# Patient Record
Sex: Female | Born: 1960 | Race: Black or African American | Hispanic: No | Marital: Single
Health system: Southern US, Community
[De-identification: ages and names within clinical notes are randomized; demographics above are authoritative.]

## PROBLEM LIST (undated history)

## (undated) DIAGNOSIS — F419 Anxiety disorder, unspecified: Secondary | ICD-10-CM

## (undated) DIAGNOSIS — I10 Essential (primary) hypertension: Secondary | ICD-10-CM

## (undated) DIAGNOSIS — Z862 Personal history of diseases of the blood and blood-forming organs and certain disorders involving the immune mechanism: Secondary | ICD-10-CM

## (undated) DIAGNOSIS — R0789 Other chest pain: Secondary | ICD-10-CM

## (undated) DIAGNOSIS — Q602 Renal agenesis, unspecified: Secondary | ICD-10-CM

## (undated) DIAGNOSIS — Z86711 Personal history of pulmonary embolism: Secondary | ICD-10-CM

## (undated) HISTORY — DX: Anxiety disorder, unspecified: F41.9

## (undated) HISTORY — DX: Personal history of pulmonary embolism: Z86.711

## (undated) HISTORY — DX: Other chest pain: R07.89

---

## 1998-04-17 ENCOUNTER — Emergency Department (HOSPITAL_COMMUNITY): Admission: EM | Admit: 1998-04-17 | Discharge: 1998-04-17 | Payer: Self-pay | Admitting: Emergency Medicine

## 1998-07-17 ENCOUNTER — Ambulatory Visit (HOSPITAL_COMMUNITY): Admission: RE | Admit: 1998-07-17 | Discharge: 1998-07-17 | Payer: Self-pay | Admitting: Unknown Physician Specialty

## 1998-07-18 ENCOUNTER — Emergency Department (HOSPITAL_COMMUNITY): Admission: EM | Admit: 1998-07-18 | Discharge: 1998-07-19 | Payer: Self-pay | Admitting: Emergency Medicine

## 1998-07-18 ENCOUNTER — Encounter: Payer: Self-pay | Admitting: Emergency Medicine

## 1999-03-24 ENCOUNTER — Emergency Department (HOSPITAL_COMMUNITY): Admission: EM | Admit: 1999-03-24 | Discharge: 1999-03-24 | Payer: Self-pay

## 2001-05-30 ENCOUNTER — Emergency Department (HOSPITAL_COMMUNITY): Admission: EM | Admit: 2001-05-30 | Discharge: 2001-05-30 | Payer: Self-pay | Admitting: *Deleted

## 2002-04-11 ENCOUNTER — Emergency Department (HOSPITAL_COMMUNITY): Admission: EM | Admit: 2002-04-11 | Discharge: 2002-04-11 | Payer: Self-pay | Admitting: Emergency Medicine

## 2002-04-11 ENCOUNTER — Encounter: Payer: Self-pay | Admitting: Emergency Medicine

## 2002-11-09 ENCOUNTER — Emergency Department (HOSPITAL_COMMUNITY): Admission: EM | Admit: 2002-11-09 | Discharge: 2002-11-09 | Payer: Self-pay | Admitting: Emergency Medicine

## 2003-05-30 ENCOUNTER — Emergency Department (HOSPITAL_COMMUNITY): Admission: EM | Admit: 2003-05-30 | Discharge: 2003-05-30 | Payer: Self-pay | Admitting: Emergency Medicine

## 2003-10-12 ENCOUNTER — Inpatient Hospital Stay (HOSPITAL_COMMUNITY): Admission: EM | Admit: 2003-10-12 | Discharge: 2003-10-13 | Payer: Self-pay | Admitting: Emergency Medicine

## 2004-03-22 ENCOUNTER — Ambulatory Visit (HOSPITAL_COMMUNITY): Admission: RE | Admit: 2004-03-22 | Discharge: 2004-03-22 | Payer: Self-pay | Admitting: *Deleted

## 2004-03-22 ENCOUNTER — Emergency Department (HOSPITAL_COMMUNITY): Admission: EM | Admit: 2004-03-22 | Discharge: 2004-03-22 | Payer: Self-pay | Admitting: Emergency Medicine

## 2004-07-07 ENCOUNTER — Emergency Department (HOSPITAL_COMMUNITY): Admission: EM | Admit: 2004-07-07 | Discharge: 2004-07-07 | Payer: Self-pay | Admitting: Emergency Medicine

## 2004-07-28 ENCOUNTER — Emergency Department (HOSPITAL_COMMUNITY): Admission: EM | Admit: 2004-07-28 | Discharge: 2004-07-28 | Payer: Self-pay | Admitting: Emergency Medicine

## 2004-09-18 ENCOUNTER — Emergency Department (HOSPITAL_COMMUNITY): Admission: EM | Admit: 2004-09-18 | Discharge: 2004-09-19 | Payer: Self-pay | Admitting: Emergency Medicine

## 2005-01-02 ENCOUNTER — Emergency Department (HOSPITAL_COMMUNITY): Admission: EM | Admit: 2005-01-02 | Discharge: 2005-01-02 | Payer: Self-pay | Admitting: Emergency Medicine

## 2005-02-02 ENCOUNTER — Emergency Department (HOSPITAL_COMMUNITY): Admission: EM | Admit: 2005-02-02 | Discharge: 2005-02-03 | Payer: Self-pay | Admitting: Emergency Medicine

## 2005-02-08 ENCOUNTER — Emergency Department (HOSPITAL_COMMUNITY): Admission: EM | Admit: 2005-02-08 | Discharge: 2005-02-09 | Payer: Self-pay | Admitting: Emergency Medicine

## 2005-04-09 ENCOUNTER — Emergency Department (HOSPITAL_COMMUNITY): Admission: EM | Admit: 2005-04-09 | Discharge: 2005-04-09 | Payer: Self-pay | Admitting: Emergency Medicine

## 2005-05-09 ENCOUNTER — Emergency Department (HOSPITAL_COMMUNITY): Admission: EM | Admit: 2005-05-09 | Discharge: 2005-05-09 | Payer: Self-pay | Admitting: Emergency Medicine

## 2005-07-04 ENCOUNTER — Emergency Department (HOSPITAL_COMMUNITY): Admission: EM | Admit: 2005-07-04 | Discharge: 2005-07-04 | Payer: Self-pay | Admitting: Emergency Medicine

## 2005-07-08 ENCOUNTER — Ambulatory Visit (HOSPITAL_COMMUNITY): Admission: RE | Admit: 2005-07-08 | Discharge: 2005-07-08 | Payer: Self-pay | Admitting: Internal Medicine

## 2005-07-08 ENCOUNTER — Ambulatory Visit: Payer: Self-pay | Admitting: Internal Medicine

## 2005-07-10 ENCOUNTER — Emergency Department (HOSPITAL_COMMUNITY): Admission: EM | Admit: 2005-07-10 | Discharge: 2005-07-10 | Payer: Self-pay | Admitting: Emergency Medicine

## 2005-07-15 ENCOUNTER — Emergency Department (HOSPITAL_COMMUNITY): Admission: EM | Admit: 2005-07-15 | Discharge: 2005-07-15 | Payer: Self-pay | Admitting: Emergency Medicine

## 2005-08-09 ENCOUNTER — Emergency Department (HOSPITAL_COMMUNITY): Admission: EM | Admit: 2005-08-09 | Discharge: 2005-08-10 | Payer: Self-pay | Admitting: Emergency Medicine

## 2005-09-06 ENCOUNTER — Emergency Department (HOSPITAL_COMMUNITY): Admission: EM | Admit: 2005-09-06 | Discharge: 2005-09-06 | Payer: Self-pay | Admitting: Emergency Medicine

## 2005-10-13 ENCOUNTER — Emergency Department (HOSPITAL_COMMUNITY): Admission: EM | Admit: 2005-10-13 | Discharge: 2005-10-13 | Payer: Self-pay | Admitting: Emergency Medicine

## 2005-10-14 ENCOUNTER — Ambulatory Visit: Payer: Self-pay | Admitting: Internal Medicine

## 2006-01-20 ENCOUNTER — Emergency Department (HOSPITAL_COMMUNITY): Admission: EM | Admit: 2006-01-20 | Discharge: 2006-01-20 | Payer: Self-pay | Admitting: Emergency Medicine

## 2006-01-23 ENCOUNTER — Emergency Department (HOSPITAL_COMMUNITY): Admission: EM | Admit: 2006-01-23 | Discharge: 2006-01-23 | Payer: Self-pay | Admitting: Emergency Medicine

## 2006-01-27 ENCOUNTER — Emergency Department (HOSPITAL_COMMUNITY): Admission: EM | Admit: 2006-01-27 | Discharge: 2006-01-27 | Payer: Self-pay | Admitting: Emergency Medicine

## 2006-02-18 ENCOUNTER — Ambulatory Visit: Payer: Self-pay | Admitting: Internal Medicine

## 2006-02-19 ENCOUNTER — Emergency Department (HOSPITAL_COMMUNITY): Admission: EM | Admit: 2006-02-19 | Discharge: 2006-02-19 | Payer: Self-pay | Admitting: Emergency Medicine

## 2006-02-21 ENCOUNTER — Encounter (INDEPENDENT_AMBULATORY_CARE_PROVIDER_SITE_OTHER): Payer: Self-pay | Admitting: *Deleted

## 2006-03-03 ENCOUNTER — Ambulatory Visit: Payer: Self-pay | Admitting: Hospitalist

## 2006-03-03 ENCOUNTER — Ambulatory Visit (HOSPITAL_COMMUNITY): Admission: RE | Admit: 2006-03-03 | Discharge: 2006-03-03 | Payer: Self-pay | Admitting: Hospitalist

## 2006-07-05 ENCOUNTER — Emergency Department (HOSPITAL_COMMUNITY): Admission: EM | Admit: 2006-07-05 | Discharge: 2006-07-05 | Payer: Self-pay | Admitting: Emergency Medicine

## 2006-07-18 ENCOUNTER — Encounter (INDEPENDENT_AMBULATORY_CARE_PROVIDER_SITE_OTHER): Payer: Self-pay | Admitting: *Deleted

## 2006-07-18 DIAGNOSIS — F411 Generalized anxiety disorder: Secondary | ICD-10-CM | POA: Insufficient documentation

## 2006-07-18 DIAGNOSIS — J45909 Unspecified asthma, uncomplicated: Secondary | ICD-10-CM | POA: Insufficient documentation

## 2006-07-18 DIAGNOSIS — R079 Chest pain, unspecified: Secondary | ICD-10-CM

## 2006-07-18 DIAGNOSIS — I10 Essential (primary) hypertension: Secondary | ICD-10-CM | POA: Insufficient documentation

## 2006-07-18 DIAGNOSIS — R002 Palpitations: Secondary | ICD-10-CM | POA: Insufficient documentation

## 2006-09-17 ENCOUNTER — Emergency Department (HOSPITAL_COMMUNITY): Admission: EM | Admit: 2006-09-17 | Discharge: 2006-09-17 | Payer: Self-pay | Admitting: Emergency Medicine

## 2006-09-20 ENCOUNTER — Ambulatory Visit: Payer: Self-pay | Admitting: Hospitalist

## 2006-09-20 ENCOUNTER — Encounter (INDEPENDENT_AMBULATORY_CARE_PROVIDER_SITE_OTHER): Payer: Self-pay | Admitting: Unknown Physician Specialty

## 2006-09-20 ENCOUNTER — Ambulatory Visit (HOSPITAL_COMMUNITY): Admission: RE | Admit: 2006-09-20 | Discharge: 2006-09-20 | Payer: Self-pay | Admitting: Hospitalist

## 2006-09-20 ENCOUNTER — Encounter (INDEPENDENT_AMBULATORY_CARE_PROVIDER_SITE_OTHER): Payer: Self-pay | Admitting: *Deleted

## 2006-09-20 LAB — CONVERTED CEMR LAB
Amphetamine Screen, Ur: NEGATIVE
Barbiturate Quant, Ur: NEGATIVE
Benzodiazepines.: NEGATIVE
CK-MB: 2.5 ng/mL (ref 0.3–4.0)
Cocaine Metabolites: NEGATIVE
Marijuana Metabolite: NEGATIVE
Methadone: NEGATIVE
Total CK: 228 units/L — ABNORMAL HIGH (ref 7–177)

## 2006-09-21 ENCOUNTER — Telehealth: Payer: Self-pay | Admitting: *Deleted

## 2006-09-22 ENCOUNTER — Ambulatory Visit: Payer: Self-pay | Admitting: Cardiovascular Disease

## 2006-09-23 ENCOUNTER — Emergency Department (HOSPITAL_COMMUNITY): Admission: EM | Admit: 2006-09-23 | Discharge: 2006-09-23 | Payer: Self-pay | Admitting: Family Medicine

## 2006-09-25 ENCOUNTER — Emergency Department (HOSPITAL_COMMUNITY): Admission: EM | Admit: 2006-09-25 | Discharge: 2006-09-25 | Payer: Self-pay | Admitting: Emergency Medicine

## 2006-11-28 ENCOUNTER — Encounter (INDEPENDENT_AMBULATORY_CARE_PROVIDER_SITE_OTHER): Payer: Self-pay | Admitting: Internal Medicine

## 2007-08-07 ENCOUNTER — Telehealth: Payer: Self-pay | Admitting: *Deleted

## 2007-08-07 ENCOUNTER — Emergency Department (HOSPITAL_COMMUNITY): Admission: EM | Admit: 2007-08-07 | Discharge: 2007-08-07 | Payer: Self-pay | Admitting: Emergency Medicine

## 2007-08-12 ENCOUNTER — Emergency Department (HOSPITAL_COMMUNITY): Admission: EM | Admit: 2007-08-12 | Discharge: 2007-08-12 | Payer: Self-pay | Admitting: Emergency Medicine

## 2007-08-15 ENCOUNTER — Ambulatory Visit: Payer: Self-pay | Admitting: Internal Medicine

## 2007-08-15 ENCOUNTER — Emergency Department (HOSPITAL_COMMUNITY): Admission: EM | Admit: 2007-08-15 | Discharge: 2007-08-15 | Payer: Self-pay | Admitting: Emergency Medicine

## 2007-08-15 ENCOUNTER — Encounter (INDEPENDENT_AMBULATORY_CARE_PROVIDER_SITE_OTHER): Payer: Self-pay | Admitting: Internal Medicine

## 2007-08-15 DIAGNOSIS — D689 Coagulation defect, unspecified: Secondary | ICD-10-CM

## 2007-08-15 DIAGNOSIS — A599 Trichomoniasis, unspecified: Secondary | ICD-10-CM

## 2007-08-15 LAB — CONVERTED CEMR LAB
Chlamydia, Swab/Urine, PCR: NEGATIVE
GC Probe Amp, Urine: NEGATIVE
Ketones, ur: NEGATIVE mg/dL
Leukocytes, UA: NEGATIVE
Nitrite: NEGATIVE
Specific Gravity, Urine: 1.025 (ref 1.005–1.03)
Urobilinogen, UA: 0.2 (ref 0.0–1.0)
pH: 5.5 (ref 5.0–8.0)

## 2007-08-16 ENCOUNTER — Encounter (INDEPENDENT_AMBULATORY_CARE_PROVIDER_SITE_OTHER): Payer: Self-pay | Admitting: Internal Medicine

## 2007-08-16 ENCOUNTER — Ambulatory Visit: Payer: Self-pay | Admitting: Internal Medicine

## 2007-08-16 LAB — CONVERTED CEMR LAB: TSH: 0.78 microintl units/mL (ref 0.350–5.50)

## 2007-08-18 ENCOUNTER — Emergency Department (HOSPITAL_COMMUNITY): Admission: EM | Admit: 2007-08-18 | Discharge: 2007-08-19 | Payer: Self-pay | Admitting: Emergency Medicine

## 2007-08-23 ENCOUNTER — Telehealth: Payer: Self-pay | Admitting: *Deleted

## 2007-08-23 LAB — CONVERTED CEMR LAB: Protein S Ag, Total: 129 % (ref 70–140)

## 2007-08-25 ENCOUNTER — Emergency Department (HOSPITAL_COMMUNITY): Admission: EM | Admit: 2007-08-25 | Discharge: 2007-08-25 | Payer: Self-pay | Admitting: Emergency Medicine

## 2007-08-30 ENCOUNTER — Encounter (INDEPENDENT_AMBULATORY_CARE_PROVIDER_SITE_OTHER): Payer: Self-pay | Admitting: Internal Medicine

## 2007-08-30 ENCOUNTER — Ambulatory Visit: Payer: Self-pay | Admitting: Internal Medicine

## 2007-08-31 ENCOUNTER — Telehealth: Payer: Self-pay | Admitting: *Deleted

## 2007-08-31 LAB — CONVERTED CEMR LAB
Anti Nuclear Antibody(ANA): NEGATIVE
BUN: 23 mg/dL (ref 6–23)
CO2: 23 meq/L (ref 19–32)
Calcium: 9.5 mg/dL (ref 8.4–10.5)
Creatinine, Ser: 0.81 mg/dL (ref 0.40–1.20)
Glucose, Bld: 84 mg/dL (ref 70–99)
HCT: 39.3 % (ref 36.0–46.0)
Hemoglobin: 12.6 g/dL (ref 12.0–15.0)
MCHC: 32.1 g/dL (ref 30.0–36.0)
MCV: 89.9 fL (ref 78.0–100.0)
Microalb, Ur: 0.2 mg/dL (ref 0.00–1.89)
Prothrombin Time: 12.5 s (ref 11.6–15.2)
RBC: 4.37 M/uL (ref 3.87–5.11)
WBC: 6.1 10*3/uL (ref 4.0–10.5)

## 2007-09-04 ENCOUNTER — Emergency Department (HOSPITAL_COMMUNITY): Admission: EM | Admit: 2007-09-04 | Discharge: 2007-09-05 | Payer: Self-pay | Admitting: Emergency Medicine

## 2007-09-08 ENCOUNTER — Emergency Department (HOSPITAL_COMMUNITY): Admission: EM | Admit: 2007-09-08 | Discharge: 2007-09-08 | Payer: Self-pay | Admitting: Emergency Medicine

## 2007-09-13 ENCOUNTER — Emergency Department (HOSPITAL_COMMUNITY): Admission: EM | Admit: 2007-09-13 | Discharge: 2007-09-13 | Payer: Self-pay | Admitting: Emergency Medicine

## 2007-09-13 ENCOUNTER — Ambulatory Visit: Payer: Self-pay | Admitting: Internal Medicine

## 2007-09-13 ENCOUNTER — Encounter: Payer: Self-pay | Admitting: Internal Medicine

## 2007-09-13 DIAGNOSIS — F4521 Hypochondriasis: Secondary | ICD-10-CM | POA: Insufficient documentation

## 2007-09-13 DIAGNOSIS — Z86718 Personal history of other venous thrombosis and embolism: Secondary | ICD-10-CM | POA: Insufficient documentation

## 2007-09-13 DIAGNOSIS — Z888 Allergy status to other drugs, medicaments and biological substances status: Secondary | ICD-10-CM

## 2007-09-13 DIAGNOSIS — R635 Abnormal weight gain: Secondary | ICD-10-CM | POA: Insufficient documentation

## 2007-09-13 DIAGNOSIS — F410 Panic disorder [episodic paroxysmal anxiety] without agoraphobia: Secondary | ICD-10-CM

## 2007-09-13 LAB — CONVERTED CEMR LAB
AST: 21 units/L (ref 0–37)
Albumin: 3.4 g/dL — ABNORMAL LOW (ref 3.5–5.2)
BUN: 18 mg/dL (ref 6–23)
Bilirubin, Direct: <0.1 mg/dL (ref 0.0–0.3)
Creatinine, Ser: 0.94 mg/dL (ref 0.40–1.20)
Glucose, Bld: 103 mg/dL — ABNORMAL HIGH (ref 70–99)
Potassium: 3.9 meq/L (ref 3.5–5.3)
Total Bilirubin: 0.5 mg/dL (ref 0.3–1.2)
Total Protein: 6.9 g/dL (ref 6.0–8.3)

## 2007-09-18 ENCOUNTER — Encounter: Payer: Self-pay | Admitting: Internal Medicine

## 2007-09-27 ENCOUNTER — Emergency Department (HOSPITAL_COMMUNITY): Admission: EM | Admit: 2007-09-27 | Discharge: 2007-09-27 | Payer: Self-pay | Admitting: Emergency Medicine

## 2007-10-08 ENCOUNTER — Emergency Department (HOSPITAL_COMMUNITY): Admission: EM | Admit: 2007-10-08 | Discharge: 2007-10-08 | Payer: Self-pay | Admitting: Emergency Medicine

## 2007-10-09 ENCOUNTER — Emergency Department (HOSPITAL_COMMUNITY): Admission: EM | Admit: 2007-10-09 | Discharge: 2007-10-09 | Payer: Self-pay | Admitting: Emergency Medicine

## 2007-10-21 ENCOUNTER — Emergency Department (HOSPITAL_COMMUNITY): Admission: EM | Admit: 2007-10-21 | Discharge: 2007-10-21 | Payer: Self-pay | Admitting: Emergency Medicine

## 2007-10-23 ENCOUNTER — Emergency Department (HOSPITAL_COMMUNITY): Admission: EM | Admit: 2007-10-23 | Discharge: 2007-10-23 | Payer: Self-pay | Admitting: Emergency Medicine

## 2007-10-24 ENCOUNTER — Telehealth: Payer: Self-pay | Admitting: *Deleted

## 2007-10-24 ENCOUNTER — Ambulatory Visit: Payer: Self-pay | Admitting: Internal Medicine

## 2007-10-24 ENCOUNTER — Encounter (INDEPENDENT_AMBULATORY_CARE_PROVIDER_SITE_OTHER): Payer: Self-pay | Admitting: Internal Medicine

## 2007-10-24 ENCOUNTER — Emergency Department (HOSPITAL_COMMUNITY): Admission: EM | Admit: 2007-10-24 | Discharge: 2007-10-24 | Payer: Self-pay | Admitting: Emergency Medicine

## 2007-10-24 DIAGNOSIS — M545 Low back pain: Secondary | ICD-10-CM

## 2007-10-26 ENCOUNTER — Ambulatory Visit (HOSPITAL_COMMUNITY): Admission: RE | Admit: 2007-10-26 | Discharge: 2007-10-26 | Payer: Self-pay | Admitting: Internal Medicine

## 2008-01-12 ENCOUNTER — Telehealth (INDEPENDENT_AMBULATORY_CARE_PROVIDER_SITE_OTHER): Payer: Self-pay | Admitting: *Deleted

## 2008-01-12 ENCOUNTER — Emergency Department (HOSPITAL_COMMUNITY): Admission: EM | Admit: 2008-01-12 | Discharge: 2008-01-12 | Payer: Self-pay | Admitting: Family Medicine

## 2008-02-28 ENCOUNTER — Encounter: Payer: Self-pay | Admitting: Licensed Clinical Social Worker

## 2008-05-08 ENCOUNTER — Emergency Department (HOSPITAL_COMMUNITY): Admission: EM | Admit: 2008-05-08 | Discharge: 2008-05-08 | Payer: Self-pay | Admitting: Emergency Medicine

## 2008-05-12 ENCOUNTER — Emergency Department (HOSPITAL_COMMUNITY): Admission: EM | Admit: 2008-05-12 | Discharge: 2008-05-12 | Payer: Self-pay | Admitting: Emergency Medicine

## 2008-05-26 ENCOUNTER — Emergency Department (HOSPITAL_COMMUNITY): Admission: EM | Admit: 2008-05-26 | Discharge: 2008-05-26 | Payer: Self-pay | Admitting: Emergency Medicine

## 2008-05-27 ENCOUNTER — Emergency Department (HOSPITAL_COMMUNITY): Admission: EM | Admit: 2008-05-27 | Discharge: 2008-05-27 | Payer: Self-pay | Admitting: Emergency Medicine

## 2008-06-25 ENCOUNTER — Telehealth (INDEPENDENT_AMBULATORY_CARE_PROVIDER_SITE_OTHER): Payer: Self-pay | Admitting: *Deleted

## 2008-07-01 ENCOUNTER — Telehealth: Payer: Self-pay | Admitting: *Deleted

## 2008-07-11 ENCOUNTER — Ambulatory Visit: Payer: Self-pay | Admitting: Internal Medicine

## 2008-07-11 ENCOUNTER — Encounter (INDEPENDENT_AMBULATORY_CARE_PROVIDER_SITE_OTHER): Payer: Self-pay | Admitting: Internal Medicine

## 2008-07-11 DIAGNOSIS — K219 Gastro-esophageal reflux disease without esophagitis: Secondary | ICD-10-CM | POA: Insufficient documentation

## 2008-07-15 ENCOUNTER — Encounter: Payer: Self-pay | Admitting: Licensed Clinical Social Worker

## 2008-08-18 IMAGING — CR DG CHEST 2V
2 series · 2 of 2 positions shown · non-contrast
Comparison: 09/17/06.

CLINICAL DATA: Fever, headache.  
 CHEST - 2 VIEW:

[w chest pa]
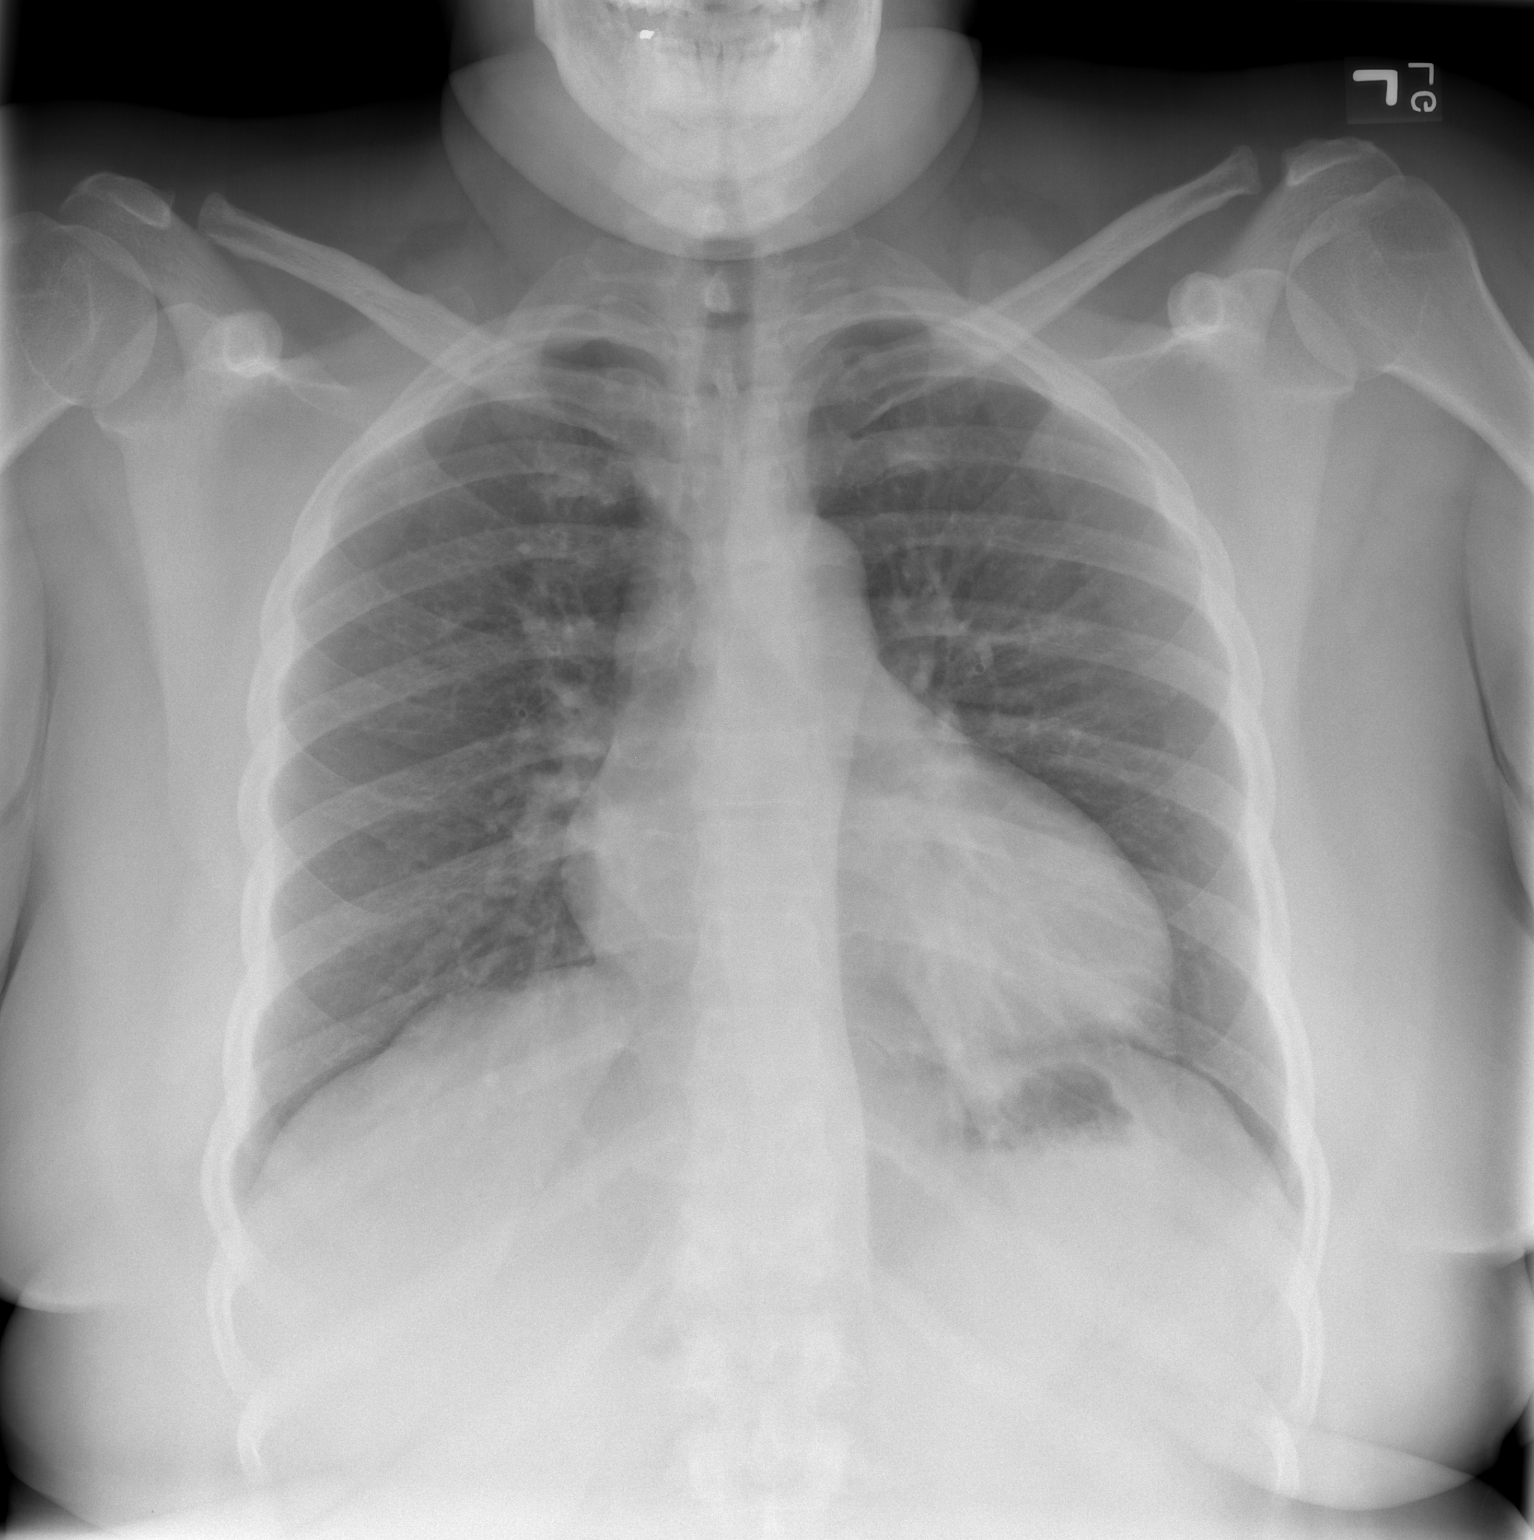

[w chest lat]
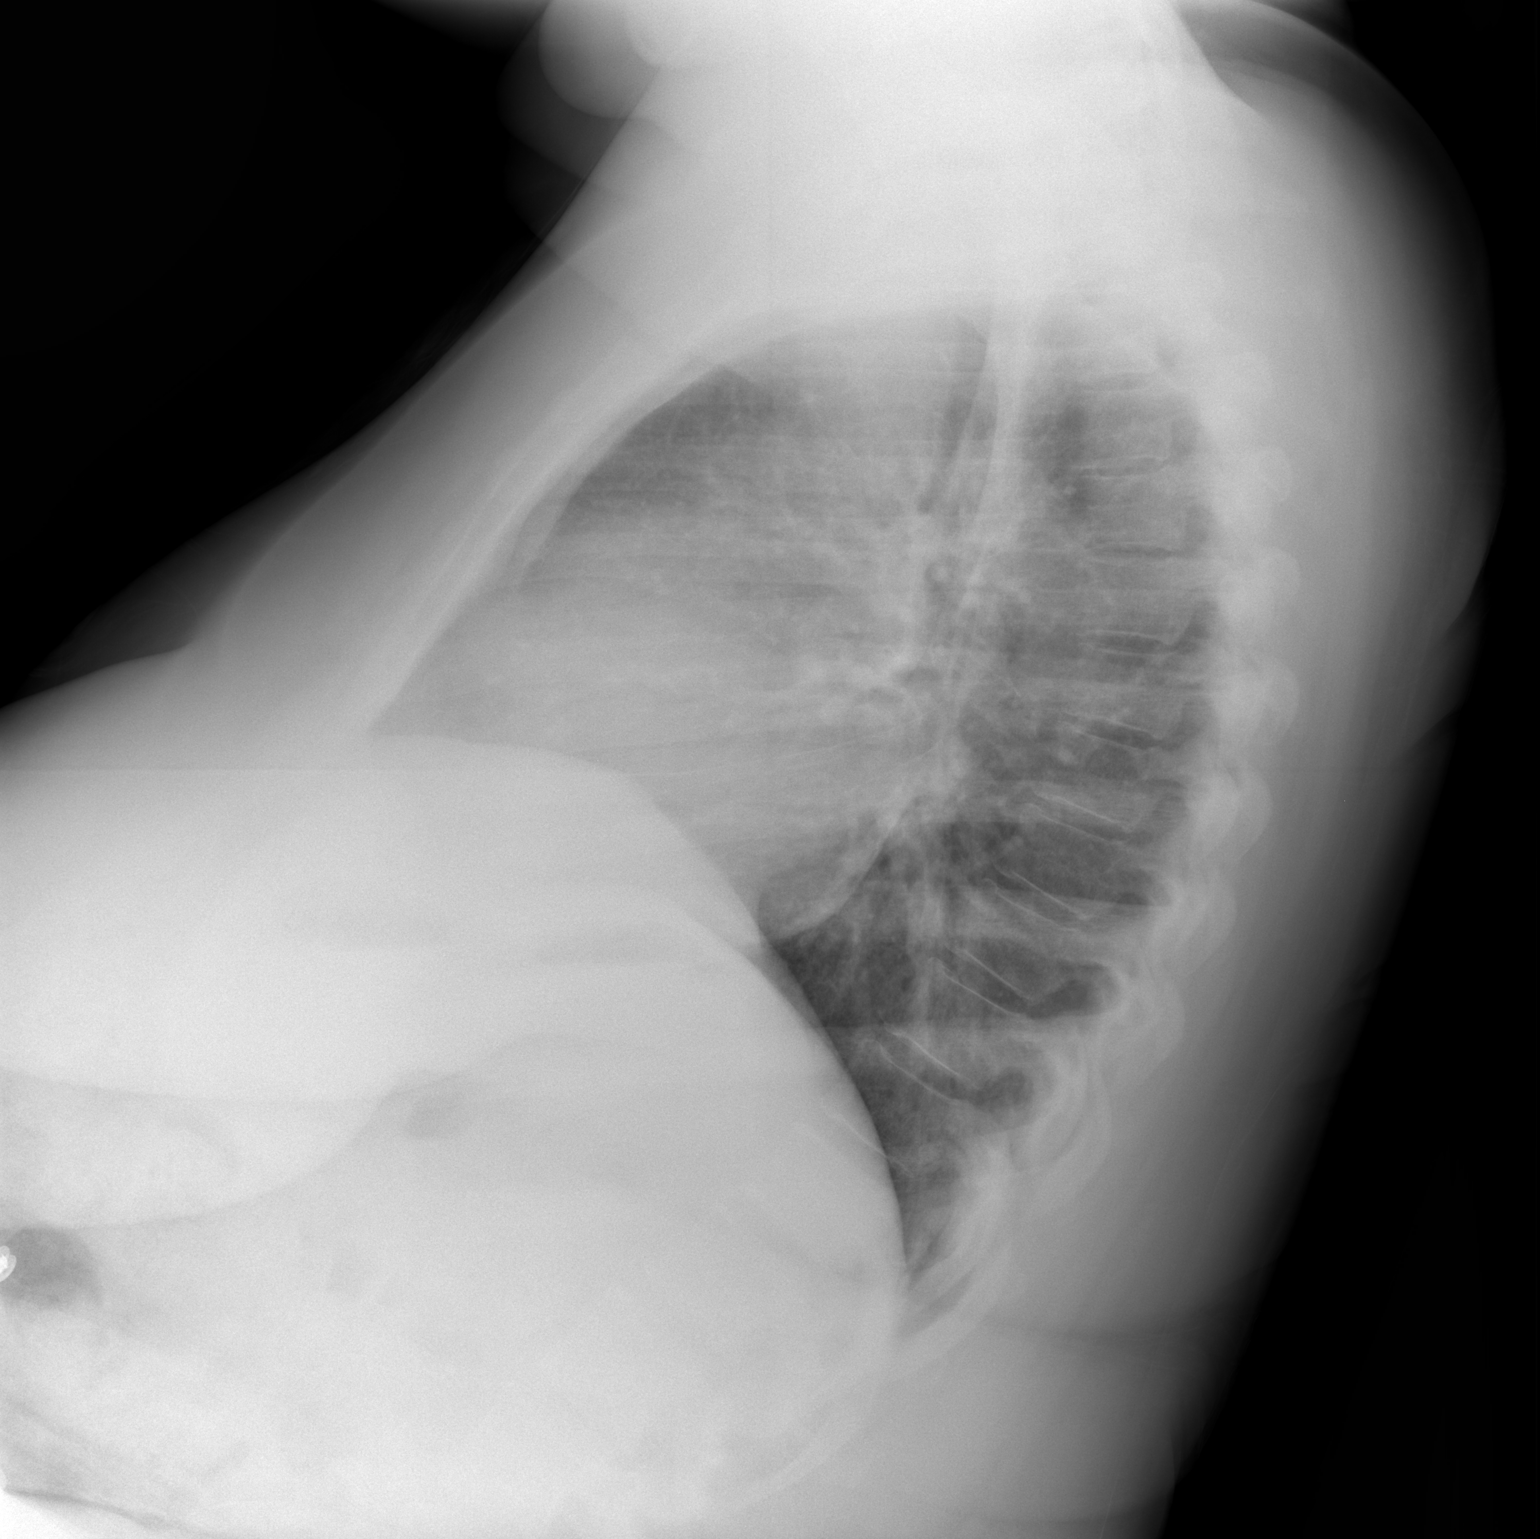

[2 of 2 positions shown; findings below may reference images not displayed]

FINDINGS: There is mild cardiomegaly.  The lungs are clear.  No pleural effusion.  No focal bony abnormality.
IMPRESSION: No acute disease with mild cardiomegaly noted.

## 2008-10-13 ENCOUNTER — Emergency Department (HOSPITAL_COMMUNITY): Admission: EM | Admit: 2008-10-13 | Discharge: 2008-10-13 | Payer: Self-pay | Admitting: Emergency Medicine

## 2008-11-01 ENCOUNTER — Emergency Department (HOSPITAL_COMMUNITY): Admission: EM | Admit: 2008-11-01 | Discharge: 2008-11-01 | Payer: Self-pay | Admitting: Emergency Medicine

## 2008-11-26 ENCOUNTER — Emergency Department (HOSPITAL_COMMUNITY): Admission: EM | Admit: 2008-11-26 | Discharge: 2008-11-26 | Payer: Self-pay | Admitting: Emergency Medicine

## 2008-11-29 ENCOUNTER — Emergency Department (HOSPITAL_COMMUNITY): Admission: EM | Admit: 2008-11-29 | Discharge: 2008-11-29 | Payer: Self-pay | Admitting: Emergency Medicine

## 2008-12-01 ENCOUNTER — Emergency Department (HOSPITAL_COMMUNITY): Admission: EM | Admit: 2008-12-01 | Discharge: 2008-12-01 | Payer: Self-pay | Admitting: Emergency Medicine

## 2008-12-02 ENCOUNTER — Encounter (INDEPENDENT_AMBULATORY_CARE_PROVIDER_SITE_OTHER): Payer: Self-pay | Admitting: *Deleted

## 2008-12-02 ENCOUNTER — Ambulatory Visit: Payer: Self-pay | Admitting: Vascular Surgery

## 2008-12-02 ENCOUNTER — Encounter: Payer: Self-pay | Admitting: Internal Medicine

## 2008-12-02 ENCOUNTER — Ambulatory Visit: Payer: Self-pay | Admitting: Internal Medicine

## 2008-12-02 ENCOUNTER — Ambulatory Visit: Admission: RE | Admit: 2008-12-02 | Discharge: 2008-12-02 | Payer: Self-pay | Admitting: Internal Medicine

## 2008-12-02 DIAGNOSIS — R609 Edema, unspecified: Secondary | ICD-10-CM | POA: Insufficient documentation

## 2008-12-03 ENCOUNTER — Observation Stay (HOSPITAL_COMMUNITY): Admission: EM | Admit: 2008-12-03 | Discharge: 2008-12-03 | Payer: Self-pay | Admitting: Emergency Medicine

## 2008-12-03 LAB — CONVERTED CEMR LAB
ALT: 19 units/L (ref 0–35)
AST: 18 units/L (ref 0–37)
Alkaline Phosphatase: 54 units/L (ref 39–117)
BUN: 17 mg/dL (ref 6–23)
Bilirubin Urine: NEGATIVE
Chloride: 105 meq/L (ref 96–112)
Creatinine, Ser: 1.21 mg/dL — ABNORMAL HIGH (ref 0.40–1.20)
GFR calc non Af Amer: 47 mL/min — ABNORMAL LOW (ref >60)
Ketones, ur: NEGATIVE mg/dL
Specific Gravity, Urine: 1.019 (ref 1.005–1.030)
TSH: 0.766 microintl units/mL (ref 0.350–4.500)
Total Bilirubin: 0.3 mg/dL (ref 0.3–1.2)
Urine Glucose: NEGATIVE mg/dL
Urobilinogen, UA: 0.2 (ref 0.0–1.0)
pH: 6 (ref 5.0–8.0)

## 2008-12-08 ENCOUNTER — Emergency Department (HOSPITAL_COMMUNITY): Admission: EM | Admit: 2008-12-08 | Discharge: 2008-12-08 | Payer: Self-pay | Admitting: Emergency Medicine

## 2008-12-22 ENCOUNTER — Other Ambulatory Visit: Payer: Self-pay | Admitting: Emergency Medicine

## 2008-12-22 ENCOUNTER — Emergency Department (HOSPITAL_COMMUNITY): Admission: EM | Admit: 2008-12-22 | Discharge: 2008-12-22 | Payer: Self-pay | Admitting: Emergency Medicine

## 2008-12-29 ENCOUNTER — Encounter: Payer: Self-pay | Admitting: Internal Medicine

## 2008-12-29 ENCOUNTER — Emergency Department (HOSPITAL_COMMUNITY): Admission: EM | Admit: 2008-12-29 | Discharge: 2008-12-29 | Payer: Self-pay | Admitting: Emergency Medicine

## 2009-07-20 ENCOUNTER — Emergency Department (HOSPITAL_COMMUNITY): Admission: EM | Admit: 2009-07-20 | Discharge: 2009-07-20 | Payer: Self-pay | Admitting: Emergency Medicine

## 2009-08-08 ENCOUNTER — Emergency Department (HOSPITAL_COMMUNITY): Admission: EM | Admit: 2009-08-08 | Discharge: 2009-08-09 | Payer: Self-pay | Admitting: Emergency Medicine

## 2009-08-17 ENCOUNTER — Emergency Department (HOSPITAL_COMMUNITY): Admission: EM | Admit: 2009-08-17 | Discharge: 2009-08-17 | Payer: Self-pay | Admitting: Emergency Medicine

## 2009-08-23 ENCOUNTER — Emergency Department (HOSPITAL_COMMUNITY): Admission: EM | Admit: 2009-08-23 | Discharge: 2009-08-23 | Payer: Self-pay | Admitting: Emergency Medicine

## 2009-09-19 ENCOUNTER — Emergency Department (HOSPITAL_COMMUNITY): Admission: EM | Admit: 2009-09-19 | Discharge: 2009-09-19 | Payer: Self-pay | Admitting: Emergency Medicine

## 2009-10-08 ENCOUNTER — Emergency Department (HOSPITAL_COMMUNITY): Admission: EM | Admit: 2009-10-08 | Discharge: 2009-10-08 | Payer: Self-pay | Admitting: Emergency Medicine

## 2009-10-08 ENCOUNTER — Encounter: Payer: Self-pay | Admitting: Internal Medicine

## 2009-10-08 ENCOUNTER — Telehealth: Payer: Self-pay | Admitting: Internal Medicine

## 2009-10-09 ENCOUNTER — Encounter: Payer: Self-pay | Admitting: Internal Medicine

## 2009-10-09 ENCOUNTER — Telehealth: Payer: Self-pay | Admitting: Internal Medicine

## 2009-10-16 IMAGING — CR DG CHEST 2V
2 series · 2 of 2 positions shown · non-contrast
Comparison: 05/26/2008

CLINICAL DATA: Pain.

CHEST - 2 VIEW

[w chest pa]
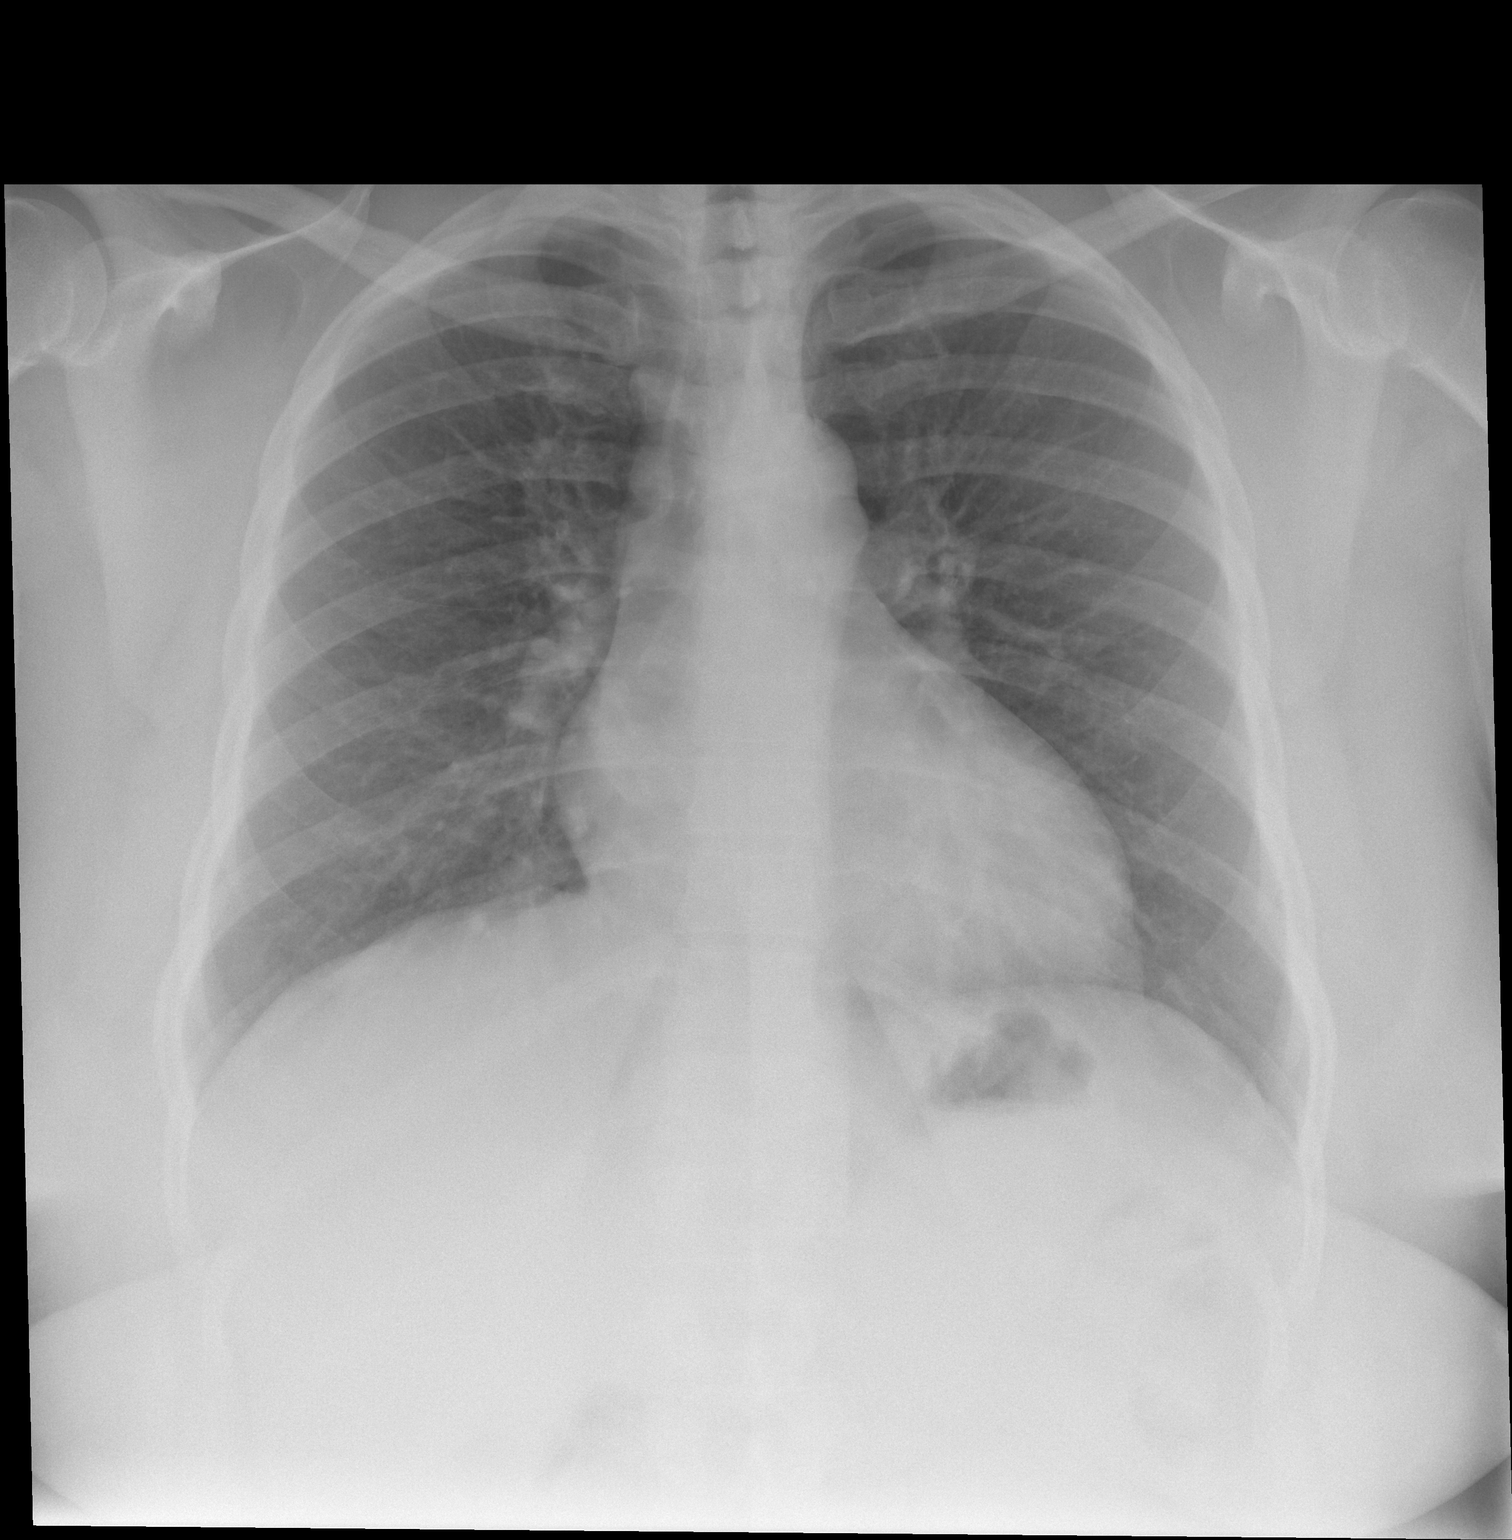

[w chest lat]
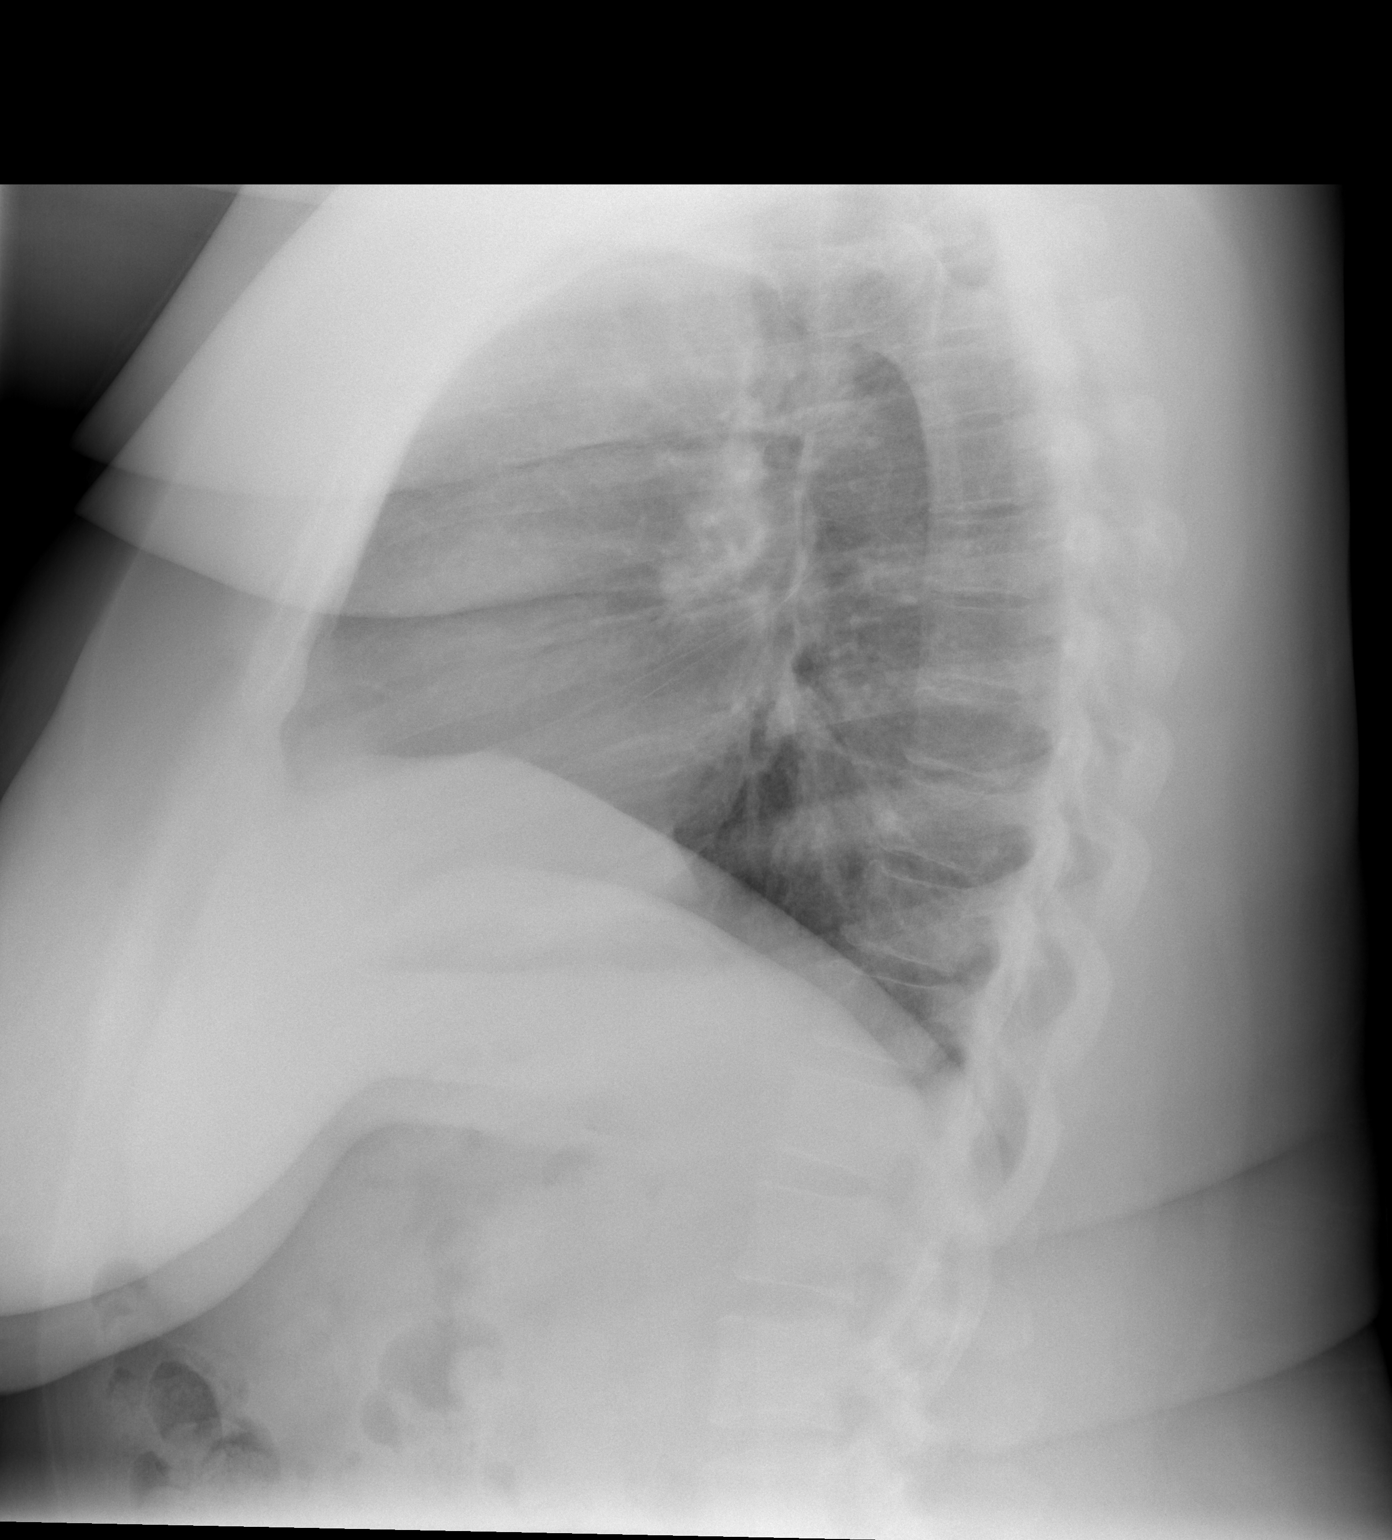

[2 of 2 positions shown; findings below may reference images not displayed]

FINDINGS: Heart is upper limits normal in size.  Lungs are clear.
No effusions.  No acute bony abnormality.
IMPRESSION: No acute findings.

## 2009-12-04 ENCOUNTER — Emergency Department (HOSPITAL_COMMUNITY): Admission: EM | Admit: 2009-12-04 | Discharge: 2009-12-04 | Payer: Self-pay | Admitting: Emergency Medicine

## 2009-12-20 ENCOUNTER — Inpatient Hospital Stay (HOSPITAL_COMMUNITY): Admission: EM | Admit: 2009-12-20 | Discharge: 2009-12-21 | Payer: Self-pay | Admitting: Emergency Medicine

## 2009-12-20 ENCOUNTER — Encounter (INDEPENDENT_AMBULATORY_CARE_PROVIDER_SITE_OTHER): Payer: Self-pay | Admitting: Internal Medicine

## 2009-12-20 ENCOUNTER — Ambulatory Visit: Payer: Self-pay | Admitting: Internal Medicine

## 2009-12-25 ENCOUNTER — Encounter (INDEPENDENT_AMBULATORY_CARE_PROVIDER_SITE_OTHER): Payer: Self-pay | Admitting: Internal Medicine

## 2010-09-01 NOTE — Progress Notes (Signed)
Summary: refill/gg  Phone Note Refill Request  on October 08, 2009 3:53 PM  Refills Requested: Medication #1:  ARIXTRA 7.5 MG/0.6ML  SOLN Take 7.5 mg Subcutaneously daily. 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  I know of no reason to be on this for yrs.  Will deny.  PT has appt with Dr Coralee Pesa.  Last time she got this was in 2010 from ER.  Follow-up by: Blanch Media MD,  October 08, 2009 4:44 PM     Appended Document: refill/gg Pt called to inform, no answer  Will try again

## 2010-09-01 NOTE — Miscellaneous (Signed)
  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

## 2010-09-01 NOTE — Progress Notes (Signed)
Summary: phone/gg  Phone Note Call from Patient   Summary of Call: Placed a call to pt to change appointment time.  She has been given an appointment with Dr Coralee Pesa for March 29th to address medication questions and routine exam as she has not been seen since 12/02/08.  SHe has been reassigned as Dr Reynold Bowen is not with the clinic at this time. Pt stated she was seen in the ED last night and was given the injection she requested ( arixtra) she was angry and stated she was getting a Clinical research associate and will sue the clinic and Dr Phillips Odor. [facility] states Dr Phillips Odor sent a record to the ED stating she was mentally ill. She wants this taken off her record.   I made the clinic appointment and explained she needs to come in and talk with her new PCP to get all her meds reviewed to see exactly what she needs to be taking.  Patient  verbalizes understanding of these instructions.  Initial call taken by: Merrie Roof RN,  October 09, 2009 10:29 AM  Follow-up for Phone Call        I did not see Ms. Condon in the ED on 3/9. The resident on call, Dr. Aldine Contes spoke with the ED physician and the patient and discussed the case with me in detail over the phone. Dr. Aldine Contes entered a note into the EMR discussing her exchange in the ED with Ms. Chales Abrahams. No letter regarding mental illness was made.  At present the patient has no indication for the medication Arixtra- especially at the full treatment dose that she is requesting. Giving her this medication at treatment dose would not be medically sound treatment or appropriate. All testing for protien C/S deficiency has been negative and CT of her chest has not shown a PE. This patient does not recieve routine care in our office and such medication would require a complete inquiry and some solid documentation. Hopefully Dr. Coralee Pesa can clarify her situation  at her follow-up appointment. I am concerned about providing such a high risk medication without a clear indictaion. I will contact  risk management regarding her threats. Although I did not see the patient, I was the attending on call and supported Dr. Aldine Contes in her plan of care. The ED physician made a decision to give the Arixtra. Our team did not feel comfortable doing this, and offered the patient a clinic appointment to discuss her concerns and to explore the nature of her request for the medication. The patient had no acute or urgent need for this medicaton. Follow-up by: Julaine Fusi  DO,  October 09, 2009 7:14 PM  Additional Follow-up for Phone Call Additional follow up Details #1::        I agree. Additional Follow-up by: Zoila Shutter MD,  October 10, 2009 11:52 AM    Additional Follow-up for Phone Call Additional follow up Details #2::    Agree as well.  Thank you Follow-up by: Mliss Sax MD,  October 10, 2009 4:03 PM

## 2010-09-01 NOTE — Miscellaneous (Signed)
Summary: Admission H and P  INTERNAL MEDICINE ADMISSION HISTORY AND PHYSICAL First Contact: Brooks Sailors [phone]) Second Contact: Vassie Loll [phone]) Attending: Dr. Blanch Media  PCP: Coralee Pesa  CC: HA / Dizziness / slurred speech/ right sided weakness  HPI:  Pt is a 50 yo female with PMH significant for HTN, protein c and s deficiency, h/o PE who presents to the ED with complaint of slurred speech, right sided weakness, HA, and dizziness.  Of note, she has presented to the ED many times in the past with similar complaints, never with any objective evidence of pathology.  She reports that her HA began 3 days PTA.  It is located just left of midline in the top of her head and behind her left eye.  It has been intermittent and 10/10 at its worst.  It has been associated with some dark spots in her vision as well as tearing on the left and rhinorrhea.  She has had similar episodes several times in the past.  This morning she was watching a movie and began to feel dizzy.  She describes it as light-headed.  She denies sensation of the room spinning.  She endorses an associated slurring of her speech and weakness in her right arm. These symptoms did not last long and had resolved by the time she presented to the ED.  She has had similar episodes 4-5 times in the past.  She denies any associated chest pain although occasionally has a sharp pain in her chest that she thought was related to indigestion but did not respond to rolaids.  This pain has not been associated with exertion.  She denies N/V/diarrhea or fevers and chills.  ALLERGIES: ! VICODIN ! HYDROCODONE ! IBUPROFEN ! NAPROXEN (NAPROXEN) ! CLONIDINE HCL   PAST MEDICAL HISTORY: Anxiety Asthma Hypertension Atypical chest pain with prior evaluations Episodes of right sided weakness, with multiple negative CT, MRI, and carotid duplexes HIstory of PE that despite great efforts, we have no documentation of  Protein C and Protein S  deficiency  MEDICATIONS: ARIXTRA 7.5 MG/0.6ML  SOLN (FONDAPARINUX SODIUM) Take 7.5 mg Subcutaneously daily. LASIX 20 MG  TABS (FUROSEMIDE) Take 1 tablet by mouth once a day. VENTOLIN HFA 108 (90 BASE) MCG/ACT AERS (ALBUTEROL SULFATE) Take one to two puffs every 6 hours as needed for shortness of breath. ASPIRIN 81mg  by mouth once daily. Protonix 40mg  daily (no taking) Fluticasone inhale 2puff two times a day (no taking)    SOCIAL HISTORY: Lives in [place] with her children.  She has lived in [place] in the past and some of her diagnoses were apparently made there. 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   Head:  normocephalic and atraumatic.   Eyes:  vision grossly intact, pupils equal, pupils round, pupils reactive to light, no injection and anicteric.  EOMI Mouth:  pharynx pink and moist, no erythema, and no exudates.   Neck:  supple, full ROM,  no JVD, and no carotid bruits.   Lungs:  normal respiratory effort, no accessory muscle use, normal breath sounds,  no crackles, and no wheezes.  Heart:  normal rate, regular rhythm, no murmur, no gallop, and no rub.   Abdomen:  soft, non-tender, normal bowel sounds, no distention, no guarding, no rebound tenderness, no hepatomegaly, and no splenomegaly.   Msk:  no joint swelling, no joint warmth, and no redness over joints.   Pulses:  2+ DP pulses bilaterally Extremities:  No cyanosis, clubbing, mild non-pitting edema in BLE's; ttp on anterior lower legs bilaterally Neurologic:  alert & oriented X3,  cranial nerves II-XII intact, strength normal in all extremities, sensation intact to light touch, negative cerebellar signs, DTR's 0 and symmetric; performance of strength exam was initially poor on the right diffusely but with repeat and persistent testing with encouraging pt, her strength was 5/5 throughtout. Skin:  turgor normal and no rashes.   Psych:  very strange affect, laughing inappropriately at times.  LABS: WBC                                      7.4               4.0-10.5         K/uL  RBC                                     4.43              3.87-5.11        MIL/uL  Hemoglobin (HGB)                         13.0              12.0-15.0        g/dL  Hematocrit (HCT)                         38.4              36.0-46.0        %  MCV                                      86.6              78.0-100.0       fL  MCHC                                     33.8              30.0-36.0        g/dL  RDW                                      14.5              11.5-15.5        %  Platelet Count (PLT)                     231               150-400  K/uL  Neutrophils, %                           61                43-77            %  Lymphocytes, %                           28                12-46            %  Monocytes, %                             9                 3-12             %  Eosinophils, %                           1                 0-5              %  Basophils, %                             1                 0-1              %  Neutrophils, Absolute                    4.5               1.7-7.7          K/uL  Lymphocytes, Absolute                    2.1               0.7-4.0          K/uL  Monocytes, Absolute                      0.7               0.1-1.0          K/uL  Eosinophils, Absolute                    0.1               0.0-0.7          K/uL  Basophils, Absolute                      0.1               0.0-0.1          K/uL  TCO2                                     25  0-100            mmol/L  Ionized Calcium                          1.20              1.12-1.32        mmol/L  Hemoglobin (HGB)                         14.3              12.0-15.0        g/dL  Hematocrit (HCT)                         42.0              36.0-46.0        %  Sodium (NA)                              140               135-145          mEq/L  Potassium (K)                            3.9               3.5-5.1          mEq/L  Chloride                                 108               96-112           mEq/L  Glucose                                  146        h      70-99            mg/dL  BUN                                      17                6-23             mg/dL  Creatinine                               0.9               0.4-1.2          mg/dL  CKMB, POC                                1.6               1.0-8.0          ng/mL  Troponin I, POC                          <  0.05             0.00-0.09        ng/mL  Myoglobin, POC                           105               12-200           ng/mL  Color, Urine                             YELLOW            YELLOW  Appearance                               CLEAR             CLEAR  Specific Gravity                         1.019             1.005-1.030  pH                                       7.0               5.0-8.0  Urine Glucose                            NEGATIVE          NEG              mg/dL  Bilirubin                                NEGATIVE          NEG  Ketones                                  NEGATIVE          NEG              mg/dL  Blood                                    NEGATIVE          NEG  Protein                                  NEGATIVE          NEG              mg/dL  Urobilinogen                             0.2               0.0-1.0          mg/dL  Nitrite  NEGATIVE          NEG  Leukocytes                               NEGATIVE          NEG  IMAGING: CT Head w/o  Contrast- Impression: normal  2 View of Chest XR- IMPRESSION:   Stable chest.  No acute disease.   ASSESSMENT AND PLAN: 1) Slurred speech and right sided weakness- This had resolved by the time she presented to the ED.  Her neurologic exam is currently without any focal deficits.  This could have represented a TIA.  She has presented several times in the past with similar deficits and reports say her workup has been negative, including MRI's, CT's, and carotid dopplers, however, we could not find evidence of MRI's or carotid dopplers in the hospital and clinic medical records.  We will check both test during this admission.  She has a h/o Protein C and S deficiency for which she has been put on fondaparinux in the past and she also has a h/o HTN, both RF's for CVA/TIA.  These symptoms could also represent complicated migraine.  She describes a HA that sounds more like cluster HA.  Pt's behavior is quite odd and I am not sure that there is not a component of some somatoform d/o as a diagnosis of exclusion. -admit to tele -check MRI head and MRA neck and carotid doppler -continue anticoagulation with arixtra -control BP with lisinopril 10mg  by mouth once daily with goal SBP140's range, DBP definitely no more than 100. -check FLP, A1c, TSH, UDS for stratification -2D Echo  2) Dizziness- May be related to problem #1.  Pt was also recently told she had labyrinthitis vs. meniere disease.  This doesn't sound like classic vertigo nor orthostatic hypotension.  Her BP's have been markedly elevated in the ED.  We will hold her lasix currently and start lisinopril as described above.  3) HA- See # 1 above.  She describes lacrimation and rhinorrhea along with her HA which is suggestive of cluster HA.  Also on the differential is complicated migraine (especially with neurologic components).  We will get above described imaging and treat her symptoms with tramadol 50mg  q6hrs as needed.  4) h/o Protein C and S  def- She has been taking arixtra 7.5 for this.  She has not been taking coumadin for this.  This was apparently originally prescribed in [place] when the pt was living there.  We will start her on a more appropriate dose based on her weight at 10mg  subcutaneously daily.  I will try to get records from [place].  5) HTN- markedly elevated BP's in ED.  Will start on lisinopril 10 mg by mouth once daily and will hold her lasix for right now.  Our goal SBP is 140-150 and DBP <100.  6) h/o CP and EKG changes- No current CP although we will risk stratify as described above and will cycle cardiac enzymes and repeat EKG in the am.    7) Asthma- continue as needed albuterol inhaler.  No wheezing on exam.  8)VTE PROPH: treatment dose arixtra as described above.  Attending Physician: I performed and/or observed a history and physical examination of the patient.  I discussed the case with the residents as noted and reviewed the residents' notes.  I agree with the findings and plan--please refer to the attending physician note for more  details. Signature Printed Name

## 2010-09-01 NOTE — Miscellaneous (Signed)
Summary: ER visit: 10/08/2009 - Pt demands shot  Pt came to ED requesting arixtra shot and explains that she needs due to her "condition". I am assuming she is talking about her h/o PE/DVT and protein C and S deficiency but it is not clear to me why is she keep running out of this medicine and coming to ED for it if she truely needs to be on it. I have reviewed ALL the records in the [place] and here is what I found. Last time she was given 1 week supply from [place] was in 08/2007 and the patient had same request saying that she got it from doctor in [place] and now that she is here she needs refill. I hear the same story again today in ED and the only record from [place] we have is from [facility] in [place] where it clearly says that she is on coumadin. This is very confusing situation and I think that there is possible psych component/?secondary gain, I am not sure. I told her that she needs to go to the clinic and have teh blood work done so that we can evaluate if she truely needs the medicine or not. I have also explained to her that this medicine is too dangerous to be perscribed by multiple doctors and it is certainly not the medicine you want to run out of and come to ED for refill. This just not the way it works out. She however, did not understand what I was talking about and she made it sound like that I don't know how this works and that she has been doing this for a while, I just wasn't around for which reason she explained will get a lawyer and will sue me and the rest of the clinic for negligence. I told her to come to clinic tomorrow for blood work but she explained that she has no money to come to [facility] any time we ask her too. So, I think we should probably try to figure this out to see if she really needs this med or not. It would help her and [place] as well.  Will route this to triage nurse.  Thank you Sherlon Handing

## 2010-09-01 NOTE — Miscellaneous (Signed)
  Clinical Lists Changes  Directives: Added new directive of DOES NOT HAVE PROTEIN C AND S DEF.   DO NOT GIVE ARIXTRA.

## 2010-10-16 ENCOUNTER — Emergency Department (HOSPITAL_COMMUNITY)
Admission: EM | Admit: 2010-10-16 | Discharge: 2010-10-16 | Disposition: A | Discharge status: Home / Self Care | Payer: Self-pay | Attending: Emergency Medicine | Admitting: Emergency Medicine

## 2010-10-16 ENCOUNTER — Emergency Department (HOSPITAL_COMMUNITY): Payer: Self-pay

## 2010-10-16 DIAGNOSIS — Z8673 Personal history of transient ischemic attack (TIA), and cerebral infarction without residual deficits: Secondary | ICD-10-CM | POA: Insufficient documentation

## 2010-10-16 DIAGNOSIS — Z79899 Other long term (current) drug therapy: Secondary | ICD-10-CM | POA: Insufficient documentation

## 2010-10-16 DIAGNOSIS — Z86711 Personal history of pulmonary embolism: Secondary | ICD-10-CM | POA: Insufficient documentation

## 2010-10-16 DIAGNOSIS — D6859 Other primary thrombophilia: Secondary | ICD-10-CM | POA: Insufficient documentation

## 2010-10-16 DIAGNOSIS — I10 Essential (primary) hypertension: Secondary | ICD-10-CM | POA: Insufficient documentation

## 2010-10-16 DIAGNOSIS — R0602 Shortness of breath: Secondary | ICD-10-CM | POA: Insufficient documentation

## 2010-10-16 DIAGNOSIS — R072 Precordial pain: Secondary | ICD-10-CM | POA: Insufficient documentation

## 2010-10-16 DIAGNOSIS — J45909 Unspecified asthma, uncomplicated: Secondary | ICD-10-CM | POA: Insufficient documentation

## 2010-10-16 LAB — POCT CARDIAC MARKERS
CKMB, poc: 2.8 ng/mL (ref 1.0–8.0)
Myoglobin, poc: 103 ng/mL (ref 12–200)
Troponin i, poc: <0.05 ng/mL (ref 0.00–0.09)

## 2010-10-16 LAB — BASIC METABOLIC PANEL
Calcium: 9 mg/dL (ref 8.4–10.5)
Chloride: 109 mEq/L (ref 96–112)
Creatinine, Ser: 0.84 mg/dL (ref 0.4–1.2)
GFR calc Af Amer: >60 mL/min (ref >60)
GFR calc non Af Amer: >60 mL/min (ref >60)

## 2010-10-16 LAB — CBC
HCT: 36.4 % (ref 36.0–46.0)
MCV: 86.5 fL (ref 78.0–100.0)
RDW: 14.4 % (ref 11.5–15.5)
WBC: 7.2 10*3/uL (ref 4.0–10.5)

## 2010-10-16 LAB — DIFFERENTIAL
Eosinophils Relative: 1 % (ref 0–5)
Lymphocytes Relative: 23 % (ref 12–46)
Lymphs Abs: 1.7 10*3/uL (ref 0.7–4.0)

## 2010-10-16 LAB — PROTIME-INR: INR: 0.97 (ref 0.00–1.49)

## 2010-10-19 LAB — CK TOTAL AND CKMB (NOT AT ARMC)
CK, MB: 2.1 ng/mL (ref 0.3–4.0)
Relative Index: 1.1 (ref 0.0–2.5)

## 2010-10-19 LAB — DIFFERENTIAL
Basophils Relative: 1 % (ref 0–1)
Eosinophils Absolute: 0.1 10*3/uL (ref 0.0–0.7)
Neutrophils Relative %: 61 % (ref 43–77)

## 2010-10-19 LAB — COMPREHENSIVE METABOLIC PANEL
ALT: 19 U/L (ref 0–35)
AST: 18 U/L (ref 0–37)
Albumin: 3.6 g/dL (ref 3.5–5.2)
Alkaline Phosphatase: 51 U/L (ref 39–117)
BUN: 14 mg/dL (ref 6–23)
Chloride: 104 mEq/L (ref 96–112)
GFR calc Af Amer: >60 mL/min (ref >60)
Potassium: 4.3 mEq/L (ref 3.5–5.1)
Sodium: 138 mEq/L (ref 135–145)
Total Bilirubin: 0.5 mg/dL (ref 0.3–1.2)
Total Protein: 7.7 g/dL (ref 6.0–8.3)

## 2010-10-19 LAB — POCT I-STAT, CHEM 8
HCT: 42 % (ref 36.0–46.0)
Hemoglobin: 14.3 g/dL (ref 12.0–15.0)
Potassium: 3.9 mEq/L (ref 3.5–5.1)
Sodium: 140 mEq/L (ref 135–145)
TCO2: 25 mmol/L (ref 0–100)

## 2010-10-19 LAB — URINALYSIS, ROUTINE W REFLEX MICROSCOPIC
Bilirubin Urine: NEGATIVE
Glucose, UA: NEGATIVE mg/dL
Hgb urine dipstick: NEGATIVE
Specific Gravity, Urine: 1.019 (ref 1.005–1.030)
Urobilinogen, UA: 0.2 mg/dL (ref 0.0–1.0)

## 2010-10-19 LAB — RAPID URINE DRUG SCREEN, HOSP PERFORMED
Amphetamines: NOT DETECTED
Benzodiazepines: NOT DETECTED
Cocaine: NOT DETECTED
Opiates: NOT DETECTED
Tetrahydrocannabinol: NOT DETECTED

## 2010-10-19 LAB — TROPONIN I: Troponin I: 0.01 ng/mL (ref 0.00–0.06)

## 2010-10-19 LAB — CBC
MCHC: 33.8 g/dL (ref 30.0–36.0)
MCV: 86.6 fL (ref 78.0–100.0)
Platelets: 231 10*3/uL (ref 150–400)
RDW: 14.5 % (ref 11.5–15.5)

## 2010-10-19 LAB — POCT CARDIAC MARKERS
CKMB, poc: 1.6 ng/mL (ref 1.0–8.0)
CKMB, poc: 1.9 ng/mL (ref 1.0–8.0)
Myoglobin, poc: 105 ng/mL (ref 12–200)

## 2010-10-19 LAB — CARDIAC PANEL(CRET KIN+CKTOT+MB+TROPI)
CK, MB: 1.7 ng/mL (ref 0.3–4.0)
Total CK: 169 U/L (ref 7–177)
Troponin I: 0.01 ng/mL (ref 0.00–0.06)

## 2010-10-19 LAB — APTT: aPTT: 35 seconds (ref 24–37)

## 2010-10-19 LAB — TSH: TSH: 1.282 u[IU]/mL (ref 0.350–4.500)

## 2010-10-20 ENCOUNTER — Telehealth: Payer: Self-pay | Admitting: *Deleted

## 2010-11-02 LAB — GLUCOSE, CAPILLARY: Glucose-Capillary: 137 mg/dL — ABNORMAL HIGH (ref 70–99)

## 2010-11-10 LAB — DIFFERENTIAL
Basophils Absolute: 0 10*3/uL (ref 0.0–0.1)
Basophils Relative: 0 % (ref 0–1)
Basophils Relative: 0 % (ref 0–1)
Eosinophils Relative: 2 % (ref 0–5)
Lymphocytes Relative: 30 % (ref 12–46)
Lymphs Abs: 1.8 10*3/uL (ref 0.7–4.0)
Monocytes Absolute: 0.4 10*3/uL (ref 0.1–1.0)
Monocytes Absolute: 0.1 10*3/uL (ref 0.1–1.0)
Monocytes Relative: 8 % (ref 3–12)
Monocytes Relative: 3 % (ref 3–12)
Neutro Abs: 3 10*3/uL (ref 1.7–7.7)
Neutro Abs: 4 10*3/uL (ref 1.7–7.7)
Neutrophils Relative %: 66 % (ref 43–77)

## 2010-11-10 LAB — CBC
HCT: 37.1 % (ref 36.0–46.0)
Hemoglobin: 12.5 g/dL (ref 12.0–15.0)
Hemoglobin: 12.3 g/dL (ref 12.0–15.0)
Hemoglobin: 13.1 g/dL (ref 12.0–15.0)
MCHC: 33.8 g/dL (ref 30.0–36.0)
MCHC: 33.8 g/dL (ref 30.0–36.0)
MCV: 87.1 fL (ref 78.0–100.0)
RBC: 4.26 MIL/uL (ref 3.87–5.11)
RBC: 4.19 MIL/uL (ref 3.87–5.11)
RDW: 14.2 % (ref 11.5–15.5)
RDW: 14.1 % (ref 11.5–15.5)
WBC: 6 10*3/uL (ref 4.0–10.5)

## 2010-11-10 LAB — URINALYSIS, ROUTINE W REFLEX MICROSCOPIC
Bilirubin Urine: NEGATIVE
Bilirubin Urine: NEGATIVE
Glucose, UA: NEGATIVE mg/dL
Glucose, UA: NEGATIVE mg/dL
Hgb urine dipstick: NEGATIVE
Nitrite: NEGATIVE
Protein, ur: NEGATIVE mg/dL
Specific Gravity, Urine: 1.024 (ref 1.005–1.030)
Urobilinogen, UA: 0.2 mg/dL (ref 0.0–1.0)
pH: 5.5 (ref 5.0–8.0)

## 2010-11-10 LAB — PROTIME-INR: INR: 1 (ref 0.00–1.49)

## 2010-11-10 LAB — BASIC METABOLIC PANEL
BUN: 16 mg/dL (ref 6–23)
CO2: 27 mEq/L (ref 19–32)
Chloride: 109 mEq/L (ref 96–112)
Creatinine, Ser: 0.91 mg/dL (ref 0.4–1.2)
Potassium: 3.9 mEq/L (ref 3.5–5.1)

## 2010-11-10 LAB — POCT I-STAT, CHEM 8
BUN: 22 mg/dL (ref 6–23)
Calcium, Ion: 1.16 mmol/L (ref 1.12–1.32)
Chloride: 109 mEq/L (ref 96–112)
Chloride: 106 mEq/L (ref 96–112)
Glucose, Bld: 115 mg/dL — ABNORMAL HIGH (ref 70–99)
HCT: 40 % (ref 36.0–46.0)
HCT: 39 % (ref 36.0–46.0)
Hemoglobin: 13.3 g/dL (ref 12.0–15.0)
Potassium: 3.9 mEq/L (ref 3.5–5.1)
Potassium: 4.5 mEq/L (ref 3.5–5.1)
Sodium: 141 mEq/L (ref 135–145)

## 2010-11-10 LAB — BRAIN NATRIURETIC PEPTIDE: Pro B Natriuretic peptide (BNP): 35 pg/mL (ref 0.0–100.0)

## 2010-11-10 LAB — POCT PREGNANCY, URINE: Preg Test, Ur: NEGATIVE

## 2010-11-10 LAB — APTT: aPTT: 35 seconds (ref 24–37)

## 2010-11-11 LAB — POCT I-STAT, CHEM 8
BUN: 19 mg/dL (ref 6–23)
Calcium, Ion: 1.19 mmol/L (ref 1.12–1.32)
Chloride: 106 mEq/L (ref 96–112)
Creatinine, Ser: 1.1 mg/dL (ref 0.4–1.2)
TCO2: 29 mmol/L (ref 0–100)

## 2010-11-11 LAB — POCT CARDIAC MARKERS: Troponin i, poc: <0.05 ng/mL (ref 0.00–0.09)

## 2010-11-11 LAB — BRAIN NATRIURETIC PEPTIDE: Pro B Natriuretic peptide (BNP): <30 pg/mL (ref 0.0–100.0)

## 2010-11-11 LAB — URINALYSIS, ROUTINE W REFLEX MICROSCOPIC
Nitrite: NEGATIVE
Protein, ur: NEGATIVE mg/dL
Specific Gravity, Urine: 1.028 (ref 1.005–1.030)
Urobilinogen, UA: 0.2 mg/dL (ref 0.0–1.0)

## 2010-11-11 LAB — GLUCOSE, CAPILLARY: Glucose-Capillary: 112 mg/dL — ABNORMAL HIGH (ref 70–99)

## 2010-11-11 LAB — PREGNANCY, URINE: Preg Test, Ur: NEGATIVE

## 2010-11-12 LAB — DIFFERENTIAL
Eosinophils Absolute: 0.1 10*3/uL (ref 0.0–0.7)
Eosinophils Relative: 1 % (ref 0–5)
Lymphocytes Relative: 28 % (ref 12–46)
Lymphs Abs: 1.5 10*3/uL (ref 0.7–4.0)
Monocytes Absolute: 0.4 10*3/uL (ref 0.1–1.0)
Monocytes Relative: 7 % (ref 3–12)

## 2010-11-12 LAB — URINALYSIS, ROUTINE W REFLEX MICROSCOPIC
Glucose, UA: NEGATIVE mg/dL
Ketones, ur: NEGATIVE mg/dL
Nitrite: NEGATIVE
Specific Gravity, Urine: 1.012 (ref 1.005–1.030)
pH: 6.5 (ref 5.0–8.0)

## 2010-11-12 LAB — D-DIMER, QUANTITATIVE: D-Dimer, Quant: 1.32 ug/mL-FEU — ABNORMAL HIGH (ref 0.00–0.48)

## 2010-11-12 LAB — BASIC METABOLIC PANEL
Chloride: 109 mEq/L (ref 96–112)
GFR calc non Af Amer: >60 mL/min (ref >60)
Potassium: 4.3 mEq/L (ref 3.5–5.1)
Sodium: 142 mEq/L (ref 135–145)

## 2010-11-12 LAB — PROTIME-INR: INR: 1 (ref 0.00–1.49)

## 2010-11-12 LAB — CBC
HCT: 36.5 % (ref 36.0–46.0)
Hemoglobin: 12.1 g/dL (ref 12.0–15.0)
MCV: 88.4 fL (ref 78.0–100.0)
RBC: 4.14 MIL/uL (ref 3.87–5.11)
WBC: 5.3 10*3/uL (ref 4.0–10.5)

## 2010-11-26 ENCOUNTER — Emergency Department (HOSPITAL_COMMUNITY)
Admission: EM | Admit: 2010-11-26 | Discharge: 2010-11-26 | Disposition: A | Discharge status: Home / Self Care | Payer: Self-pay | Attending: Emergency Medicine | Admitting: Emergency Medicine

## 2010-11-26 ENCOUNTER — Encounter (HOSPITAL_COMMUNITY): Payer: Self-pay | Admitting: Radiology

## 2010-11-26 ENCOUNTER — Emergency Department (HOSPITAL_COMMUNITY): Payer: Self-pay

## 2010-11-26 DIAGNOSIS — J45909 Unspecified asthma, uncomplicated: Secondary | ICD-10-CM | POA: Insufficient documentation

## 2010-11-26 DIAGNOSIS — E669 Obesity, unspecified: Secondary | ICD-10-CM | POA: Insufficient documentation

## 2010-11-26 DIAGNOSIS — R079 Chest pain, unspecified: Secondary | ICD-10-CM | POA: Insufficient documentation

## 2010-11-26 DIAGNOSIS — I10 Essential (primary) hypertension: Secondary | ICD-10-CM | POA: Insufficient documentation

## 2010-11-26 DIAGNOSIS — Z8673 Personal history of transient ischemic attack (TIA), and cerebral infarction without residual deficits: Secondary | ICD-10-CM | POA: Insufficient documentation

## 2010-11-26 DIAGNOSIS — I517 Cardiomegaly: Secondary | ICD-10-CM | POA: Insufficient documentation

## 2010-11-26 DIAGNOSIS — R5381 Other malaise: Secondary | ICD-10-CM | POA: Insufficient documentation

## 2010-11-26 DIAGNOSIS — Z86711 Personal history of pulmonary embolism: Secondary | ICD-10-CM | POA: Insufficient documentation

## 2010-11-26 HISTORY — DX: Essential (primary) hypertension: I10

## 2010-11-26 HISTORY — DX: Renal agenesis, unspecified: Q60.2

## 2010-11-26 LAB — COMPREHENSIVE METABOLIC PANEL
Alkaline Phosphatase: 53 U/L (ref 39–117)
BUN: 15 mg/dL (ref 6–23)
Chloride: 105 mEq/L (ref 96–112)
GFR calc non Af Amer: >60 mL/min (ref >60)
Glucose, Bld: 96 mg/dL (ref 70–99)
Potassium: 4 mEq/L (ref 3.5–5.1)
Total Bilirubin: 0.4 mg/dL (ref 0.3–1.2)
Total Protein: 7.5 g/dL (ref 6.0–8.3)

## 2010-11-26 LAB — URINALYSIS, ROUTINE W REFLEX MICROSCOPIC
Bilirubin Urine: NEGATIVE
Glucose, UA: NEGATIVE mg/dL
Hgb urine dipstick: NEGATIVE
Ketones, ur: NEGATIVE mg/dL
Nitrite: NEGATIVE
Protein, ur: NEGATIVE mg/dL
Specific Gravity, Urine: 1.026 (ref 1.005–1.030)
Urobilinogen, UA: 1 mg/dL (ref 0.0–1.0)
pH: 6.5 (ref 5.0–8.0)

## 2010-11-26 LAB — COMPREHENSIVE METABOLIC PANEL WITH GFR
ALT: 19 U/L (ref 0–35)
AST: 20 U/L (ref 0–37)
Albumin: 3.6 g/dL (ref 3.5–5.2)
CO2: 28 meq/L (ref 19–32)
Calcium: 9.2 mg/dL (ref 8.4–10.5)
Creatinine, Ser: 0.96 mg/dL (ref 0.4–1.2)
GFR calc Af Amer: >60 mL/min (ref >60)
Sodium: 137 meq/L (ref 135–145)

## 2010-11-26 LAB — DIFFERENTIAL
Basophils Absolute: 0 K/uL (ref 0.0–0.1)
Basophils Relative: 0 % (ref 0–1)
Eosinophils Absolute: 0.1 K/uL (ref 0.0–0.7)
Eosinophils Relative: 2 % (ref 0–5)
Lymphocytes Relative: 30 % (ref 12–46)
Lymphs Abs: 2.1 10*3/uL (ref 0.7–4.0)
Monocytes Absolute: 0.6 K/uL (ref 0.1–1.0)
Monocytes Relative: 8 % (ref 3–12)
Neutro Abs: 4.1 10*3/uL (ref 1.7–7.7)
Neutrophils Relative %: 60 % (ref 43–77)

## 2010-11-26 LAB — POCT CARDIAC MARKERS
CKMB, poc: 1.2 ng/mL (ref 1.0–8.0)
Myoglobin, poc: 103 ng/mL (ref 12–200)
Troponin i, poc: <0.05 ng/mL (ref 0.00–0.09)

## 2010-11-26 LAB — CBC
HCT: 38.2 % (ref 36.0–46.0)
Hemoglobin: 12.6 g/dL (ref 12.0–15.0)
MCH: 28.8 pg (ref 26.0–34.0)
MCHC: 33 g/dL (ref 30.0–36.0)
MCV: 87.4 fL (ref 78.0–100.0)
Platelets: 367 10*3/uL (ref 150–400)
RBC: 4.37 MIL/uL (ref 3.87–5.11)
RDW: 14.6 % (ref 11.5–15.5)
WBC: 6.8 10*3/uL (ref 4.0–10.5)

## 2010-11-26 LAB — D-DIMER, QUANTITATIVE: D-Dimer, Quant: 2.21 ug/mL-FEU — ABNORMAL HIGH (ref 0.00–0.48)

## 2010-11-26 MED ORDER — IOHEXOL 350 MG/ML SOLN
100.0000 mL | Freq: Once | INTRAVENOUS | Status: AC | PRN
  Administered 2010-11-26: 100 mL via INTRAVENOUS

## 2010-11-27 LAB — T4, FREE: Free T4: 1.21 ng/dL (ref 0.80–1.80)

## 2010-11-27 LAB — TSH: TSH: 0.95 u[IU]/mL (ref 0.350–4.500)

## 2010-12-18 NOTE — Assessment & Plan Note (Signed)
[facility]                            CARDIOLOGY OFFICE NOTE   Angelica Horn, Angelica Horn                         MRN:          [id number]  DATE:09/22/2006                            DOB:          [birth year]    Ms. Angelica Horn seen today as a new patient.  She was seen at the [facility]. She is a somewhat unusual individual. She spends her time both  in [place] and [place], where she is originally from. She has had  atypical chest pain. She describes her pain in pictorial detail and she  wanted a picture of a model of the heart and showed me where she hurts  both anteriorly and posteriorly.  She also feels like she has a tickle  in the back of her chest, which drives her crazy. In short, her pain is  totally atypical for heart disease. Her coronary risk factors primarily  include smoking and positive family history, there is also history of  hypertension.   The patient is unhappy with her choice of drugs for hypertension. She is  on a diuretic and does not like to urinate all the time, apparently she  did not tolerate beta blockers in the past.   REVIEW OF SYSTEMS:  Remarkable for occasional palpitations and  dizziness. She has never had syncope.   She has been on blood pressure medicine for a couple of years.   The patient describes having had the stress test previously at [facility] [place] in [place], she apparently is scheduled for an echo and a stress  test at [facility] next Tuesday.   She does not have any allergies but NAPROSYN makes her feel sick.   She has had 2 previous C-Sections, 1 in 1981, and 1986.   She describes herself as a housewife. 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 There was a man with her today who is her boyfriend.   Her mother died at age 51 of a heart attack. Her father is alive at 51,  he has had bypass. She is on hydrochlorothiazide, albuterol, and Zantac,  and Ativan p.r.n.   Exam is remarkable for  a blood pressure of 120/70, pulse 70 and regular.  HEENT: Normal.  LUNGS: Clear. There are no carotid bruits, no lymphadenopathy.  There is an S1, S2, normal heart sounds.  ABDOMEN: Benign.  LOWER EXTREMITIES: Intact pulses, no edema.  Neuro non-focal  Skin warm and dry   EKG is essentially normal with the Q wave in lead III.   IMPRESSION:  Atypical chest pain, hypertension.   The patient will have her follow up echo and Myoview at [facility].  I suspect these will be normal. I do not think she has significant LVH.  Since she does not like her hydrochlorothiazide, I gave her a  prescription for lisinopril 5 mg a day, as a generic prescription to  start taking for her high blood pressure. She will follow up at the [facility] for this. I suspect that this will take care of the problem and  the dose can be escalated as needed.  As long as her echo and Myoview  are normal, she will not need further cardiac follow up.   Again, the patient's demeanor and personality is a little bit unusual.  She is a little bit evasive and frankly describes her chest pain  syndrome in a very bizarre way.     Noralyn Pick. Eden Emms, MD, [facility]  Electronically Signed    PCN/MedQ  DD: 09/22/2006  DT: 09/22/2006  Job #: [id number]   cc:   Dr. Allena Katz

## 2010-12-18 NOTE — Discharge Summary (Signed)
NAMEQuenten Horn                            ACCOUNT NO.:  [id number]   MEDICAL RECORD NO.:  [id number]                   PATIENT TYPE:  INP   LOCATION:  0357                                 FACILITY:  [facility]   PHYSICIAN:  Jackie Plum, M.D.             DATE OF BIRTH:  1961-01-09   DATE OF ADMISSION:  10/11/2003  DATE OF DISCHARGE:  10/13/2003                                 DISCHARGE SUMMARY   DISCHARGE DIAGNOSES:  1. Acute bronchial asthma exacerbation secondary to bronchitis, resolved.  2. Acute bronchitis.  3. Abnormal chest CT.     a. CT done on admission negative for pulmonary embolus, shotty bilateral        hilar/mediastinal lymph nodes, nonspecific central peribronchial        thickening, and hilar opacifications with a differential diagnosis of        sarcoid versus inflammatory versus infectious etiology.  4. Obesity.  5. Back problems.  6. Severe smoking.   DISCHARGE MEDICATIONS:  1. Prednisone 40 mg p.o. daily for three days.  2. Aerobid two puffs b.i.d.  3. Albuterol MDI two puffs q.i.d.  4. Avelox 400 mg daily for three days.  5. Ambien 5 mg q.h.s.  6. Darvocet-N 100 one or two tablets q.4h. p.r.n.   ACTIVITY:  As tolerated.   DIET:  Low-fat diet.   SPECIAL INSTRUCTIONS:  The patient has been counseled to stop cigarette  smoking.   FOLLOWUP:  She will follow up with [facility].   CONSULTANTS:  Not applicable.   PROCEDURES:  Not applicable.   CONDITION ON DISCHARGE:  Improved and satisfactory.   REASON FOR ADMISSION:  Shortness of breath.  The patient presented with a  three-day history of shortness of breath associated with cough productive of  light yellowish sputum and some chills.  She had vomited two times at home  and has been feeling weak.  She had a fever on admission.   PHYSICAL EXAMINATION:  LUNGS:  On admitting physical exam, she had rhonchi  and expiratory wheezes.  CARDIAC:  Regular rate and rhythm without any  gallops.  EXTREMITIES:  She did not have any edema.   LABORATORY DATA:  X-ray was negative for acute infiltrate.  Chest CT was  done by the ER physician, which showed above.  The admitting metabolic panel  was unremarkable.  She did not have any leukocytosis.  Her D-dimer at the ER  was elevated for which CT followup was done and ruled out PE.  The patient  was therefore admitted to the hospitalist service for acute bronchial asthma  exacerbation.   HOSPITAL COURSE:  Angelica Horn was admitted to the hospitalist service with  telemetry monitoring.  There were no significant consistent dysrhythmias.  She was started on IV steroids with IV antibiotics and IV fluid  supplementation.  She received scheduled nebulizer treatments.  With these  measures, the patient's symptoms have improved  this morning.  She is doing  well.  She does not have any complaints of chest pain, shortness of breath,  fever, or chills.  She has been ambulatory without any shortness of breath.  Her vital signs this morning were BP 151/82, pulse rate of 70, temperature  of 98.1 degrees Fahrenheit, and O2 saturation of 94% on room air.  Exam  shows no acute cardiopulmonary distress.  She was well looking and no ill  looking.  HEENT with no JVD.  Lungs notable for fascicular breath sounds  with adequate air entry.  She had mild wheezes, which are significantly  improved from admission.  Cardiac exam notable for regular rate and rhythm  without any gallops or murmur.  The extremity exam did not have any edema.  She was alert and oriented x 3.  No acute focal deficits.   The patient's acute asthma exacerbation has resolved.  The exacerbating  factor is believed to be bronchitis.  However, her abdominal CT scan showed  lymphadenopathy with some ___________ changes and hilar changes are  worrisome for possible sarcoidosis.  Recommend outpatient followup CT scan  in a couple of weeks.  The patient has been adequately educated and  informed  about this abnormal CT scan.  She needs to absolutely have a followup for  further elucidation of the nature of the abnormality.   DISPOSITION:  Angelica Horn is discharged home in stable and satisfactory  condition.   DISCHARGE LABORATORIES:  WBC count 8.5, hemoglobin 12.9, hematocrit 38.6,  MCV 86.5, platelet count 40.  Sodium 135, potassium 3.6, chloride 106, CO2  24, glucose 94, BUN 15, creatinine 1.5.  Total cholesterol 136,  triglycerides 91, total HDL 36, LDL 82.                                               Jackie Plum, M.D.    GO/MEDQ  D:  10/13/2003  T:  10/13/2003  Job:  [id number]   cc:   [facility]

## 2011-01-07 ENCOUNTER — Encounter: Payer: Self-pay | Admitting: Internal Medicine

## 2011-03-20 ENCOUNTER — Emergency Department (HOSPITAL_COMMUNITY): Payer: Self-pay

## 2011-03-20 ENCOUNTER — Emergency Department (HOSPITAL_COMMUNITY)
Admission: EM | Admit: 2011-03-20 | Discharge: 2011-03-20 | Disposition: A | Discharge status: Home / Self Care | Payer: Self-pay | Attending: Emergency Medicine | Admitting: Emergency Medicine

## 2011-03-20 DIAGNOSIS — I10 Essential (primary) hypertension: Secondary | ICD-10-CM | POA: Insufficient documentation

## 2011-03-20 DIAGNOSIS — M79609 Pain in unspecified limb: Secondary | ICD-10-CM | POA: Insufficient documentation

## 2011-03-20 DIAGNOSIS — W101XXA Fall (on)(from) sidewalk curb, initial encounter: Secondary | ICD-10-CM | POA: Insufficient documentation

## 2011-03-20 DIAGNOSIS — S93409A Sprain of unspecified ligament of unspecified ankle, initial encounter: Secondary | ICD-10-CM | POA: Insufficient documentation

## 2011-03-24 ENCOUNTER — Emergency Department (HOSPITAL_COMMUNITY)
Admission: EM | Admit: 2011-03-24 | Discharge: 2011-03-25 | Disposition: A | Discharge status: Home / Self Care | Payer: Self-pay | Attending: Emergency Medicine | Admitting: Emergency Medicine

## 2011-03-24 DIAGNOSIS — I10 Essential (primary) hypertension: Secondary | ICD-10-CM | POA: Insufficient documentation

## 2011-03-24 DIAGNOSIS — R51 Headache: Secondary | ICD-10-CM | POA: Insufficient documentation

## 2011-03-24 DIAGNOSIS — D6859 Other primary thrombophilia: Secondary | ICD-10-CM | POA: Insufficient documentation

## 2011-03-25 ENCOUNTER — Encounter (HOSPITAL_COMMUNITY): Payer: Self-pay

## 2011-03-25 ENCOUNTER — Emergency Department (HOSPITAL_COMMUNITY): Payer: Self-pay

## 2011-04-06 ENCOUNTER — Emergency Department (HOSPITAL_COMMUNITY)
Admission: EM | Admit: 2011-04-06 | Discharge: 2011-04-06 | Disposition: A | Discharge status: Home / Self Care | Payer: Self-pay | Attending: Emergency Medicine | Admitting: Emergency Medicine

## 2011-04-06 ENCOUNTER — Emergency Department (HOSPITAL_COMMUNITY): Payer: Self-pay

## 2011-04-06 DIAGNOSIS — X500XXA Overexertion from strenuous movement or load, initial encounter: Secondary | ICD-10-CM | POA: Insufficient documentation

## 2011-04-06 DIAGNOSIS — S335XXA Sprain of ligaments of lumbar spine, initial encounter: Secondary | ICD-10-CM | POA: Insufficient documentation

## 2011-04-06 DIAGNOSIS — M545 Low back pain, unspecified: Secondary | ICD-10-CM | POA: Insufficient documentation

## 2011-04-06 LAB — URINALYSIS, ROUTINE W REFLEX MICROSCOPIC
Bilirubin Urine: NEGATIVE
Glucose, UA: NEGATIVE mg/dL
Hgb urine dipstick: NEGATIVE
Specific Gravity, Urine: 1.028 (ref 1.005–1.030)
pH: 6 (ref 5.0–8.0)

## 2011-04-07 ENCOUNTER — Emergency Department (HOSPITAL_COMMUNITY)
Admission: EM | Admit: 2011-04-07 | Discharge: 2011-04-07 | Disposition: A | Discharge status: Home / Self Care | Payer: Self-pay | Attending: Emergency Medicine | Admitting: Emergency Medicine

## 2011-04-07 DIAGNOSIS — I10 Essential (primary) hypertension: Secondary | ICD-10-CM | POA: Insufficient documentation

## 2011-04-07 DIAGNOSIS — X500XXA Overexertion from strenuous movement or load, initial encounter: Secondary | ICD-10-CM | POA: Insufficient documentation

## 2011-04-07 DIAGNOSIS — M25659 Stiffness of unspecified hip, not elsewhere classified: Secondary | ICD-10-CM | POA: Insufficient documentation

## 2011-04-07 DIAGNOSIS — M545 Low back pain, unspecified: Secondary | ICD-10-CM | POA: Insufficient documentation

## 2011-04-07 DIAGNOSIS — S335XXA Sprain of ligaments of lumbar spine, initial encounter: Secondary | ICD-10-CM | POA: Insufficient documentation

## 2011-04-21 LAB — CBC
MCHC: 33.6
Platelets: 351
RBC: 4.06
WBC: 6.2

## 2011-04-21 LAB — URINALYSIS, ROUTINE W REFLEX MICROSCOPIC
Glucose, UA: NEGATIVE
Hgb urine dipstick: NEGATIVE
Specific Gravity, Urine: 1.02

## 2011-04-21 LAB — BASIC METABOLIC PANEL
BUN: 15
CO2: 31
Calcium: 9.5
Creatinine, Ser: 0.86
GFR calc Af Amer: >60

## 2011-04-21 LAB — PROTIME-INR
INR: 1
Prothrombin Time: 13

## 2011-04-21 LAB — POCT CARDIAC MARKERS
Operator id: 1211
Troponin i, poc: <0.05

## 2011-04-21 LAB — URINE MICROSCOPIC-ADD ON

## 2011-04-21 LAB — DIFFERENTIAL
Basophils Relative: 1
Monocytes Relative: 9
Neutro Abs: 3.9
Neutrophils Relative %: 63

## 2011-04-23 LAB — COMPREHENSIVE METABOLIC PANEL
ALT: 17
AST: 18
AST: 19
Alkaline Phosphatase: 55
CO2: 27
CO2: 28
Calcium: 9
Calcium: 9.3
Chloride: 105
Creatinine, Ser: 0.85
GFR calc Af Amer: >60
GFR calc Af Amer: >60
GFR calc non Af Amer: >60
GFR calc non Af Amer: >60
Glucose, Bld: 113 — ABNORMAL HIGH
Potassium: 3.9
Sodium: 139
Total Bilirubin: 0.6

## 2011-04-23 LAB — DIFFERENTIAL
Eosinophils Absolute: 0.1
Eosinophils Relative: 1
Lymphocytes Relative: 37
Lymphs Abs: 1.4
Lymphs Abs: 1.8
Neutro Abs: 2.6
Neutrophils Relative %: 53

## 2011-04-23 LAB — PROTIME-INR
INR: 0.9
Prothrombin Time: 12.6
Prothrombin Time: 12.7

## 2011-04-23 LAB — URINALYSIS, ROUTINE W REFLEX MICROSCOPIC
Bilirubin Urine: NEGATIVE
Bilirubin Urine: NEGATIVE
Glucose, UA: NEGATIVE
Glucose, UA: NEGATIVE
Hgb urine dipstick: NEGATIVE
Ketones, ur: NEGATIVE
Ketones, ur: NEGATIVE
Specific Gravity, Urine: 1.018
pH: 7
pH: 6.5

## 2011-04-23 LAB — URINE MICROSCOPIC-ADD ON

## 2011-04-23 LAB — CBC
MCHC: 33.8
MCV: 86.3
RBC: 4.01
RBC: 4.2
WBC: 6

## 2011-04-23 LAB — D-DIMER, QUANTITATIVE: D-Dimer, Quant: 2.1 — ABNORMAL HIGH

## 2011-04-23 LAB — POCT CARDIAC MARKERS: Operator id: 3067

## 2011-04-23 LAB — URINE CULTURE
Colony Count: NO GROWTH
Culture: NO GROWTH

## 2011-04-23 LAB — APTT: aPTT: 29

## 2011-04-24 ENCOUNTER — Emergency Department (HOSPITAL_COMMUNITY): Payer: Self-pay

## 2011-04-24 ENCOUNTER — Emergency Department (HOSPITAL_COMMUNITY)
Admission: EM | Admit: 2011-04-24 | Discharge: 2011-04-24 | Disposition: A | Discharge status: Home / Self Care | Payer: Self-pay | Attending: Emergency Medicine | Admitting: Emergency Medicine

## 2011-04-24 DIAGNOSIS — I10 Essential (primary) hypertension: Secondary | ICD-10-CM | POA: Insufficient documentation

## 2011-04-24 DIAGNOSIS — Z79899 Other long term (current) drug therapy: Secondary | ICD-10-CM | POA: Insufficient documentation

## 2011-04-24 DIAGNOSIS — J40 Bronchitis, not specified as acute or chronic: Secondary | ICD-10-CM | POA: Insufficient documentation

## 2011-04-26 LAB — I-STAT 8, (EC8 V) (CONVERTED LAB)
Acid-Base Excess: 1
Bicarbonate: 26.5 — ABNORMAL HIGH
Potassium: 4
Sodium: 140
TCO2: 28
pH, Ven: 7.386 — ABNORMAL HIGH

## 2011-04-26 LAB — URINE MICROSCOPIC-ADD ON

## 2011-04-26 LAB — POCT I-STAT, CHEM 8
BUN: 16
Calcium, Ion: 1.27
Chloride: 104
Creatinine, Ser: 1.1
Glucose, Bld: 130 — ABNORMAL HIGH
HCT: 40
Hemoglobin: 13.6
Potassium: 3.9
Sodium: 142
TCO2: 31

## 2011-04-26 LAB — URINALYSIS, ROUTINE W REFLEX MICROSCOPIC
Ketones, ur: NEGATIVE
Protein, ur: NEGATIVE
Urobilinogen, UA: 0.2

## 2011-04-26 LAB — COMPREHENSIVE METABOLIC PANEL
AST: 24
BUN: 15
CO2: 26
Calcium: 9.1
Chloride: 103
Creatinine, Ser: 0.85
GFR calc Af Amer: >60
GFR calc non Af Amer: >60
Glucose, Bld: 113 — ABNORMAL HIGH
Total Bilirubin: 0.3

## 2011-04-26 LAB — PROTIME-INR
INR: 1
Prothrombin Time: 12.9

## 2011-04-26 LAB — DIFFERENTIAL
Basophils Absolute: 0
Lymphocytes Relative: 28
Lymphs Abs: 1.9
Neutro Abs: 4.3
Neutrophils Relative %: 63

## 2011-04-26 LAB — CBC
HCT: 35.7 — ABNORMAL LOW
MCHC: 33.3
MCV: 86.7
RBC: 4.12

## 2011-04-26 LAB — APTT: aPTT: 31

## 2011-05-03 LAB — URINALYSIS, ROUTINE W REFLEX MICROSCOPIC
Bilirubin Urine: NEGATIVE
Glucose, UA: NEGATIVE
Ketones, ur: NEGATIVE
Nitrite: NEGATIVE
Specific Gravity, Urine: 1.015
pH: 7

## 2011-05-03 LAB — DIFFERENTIAL
Basophils Absolute: 0
Basophils Absolute: 0
Basophils Relative: 1
Eosinophils Absolute: 0.1
Eosinophils Absolute: 0.1
Eosinophils Relative: 1
Lymphocytes Relative: 36
Monocytes Absolute: 0.5
Monocytes Relative: 9
Neutro Abs: 3
Neutrophils Relative %: 56

## 2011-05-03 LAB — POCT CARDIAC MARKERS
CKMB, poc: 1.9
CKMB, poc: 2.2
Myoglobin, poc: 94.4
Troponin i, poc: <0.05

## 2011-05-03 LAB — D-DIMER, QUANTITATIVE: D-Dimer, Quant: 1.94 — ABNORMAL HIGH

## 2011-05-03 LAB — COMPREHENSIVE METABOLIC PANEL
ALT: 16
Alkaline Phosphatase: 54
BUN: 11
CO2: 30
Chloride: 104
GFR calc non Af Amer: >60
Glucose, Bld: 100 — ABNORMAL HIGH
Potassium: 4.6
Sodium: 139
Total Bilirubin: 0.8

## 2011-05-03 LAB — CBC
HCT: 37.3
HCT: 38
Hemoglobin: 12.4
Hemoglobin: 12.4
MCV: 88.2
Platelets: 324
RBC: 4.32
RDW: 14.4
RDW: 14.8

## 2011-05-03 LAB — POCT I-STAT, CHEM 8
BUN: 17
Creatinine, Ser: 1.1
Glucose, Bld: 109 — ABNORMAL HIGH
Hemoglobin: 12.9
Potassium: 4.5
Sodium: 140
TCO2: 23

## 2011-05-03 LAB — LIPASE, BLOOD: Lipase: 17

## 2011-08-17 ENCOUNTER — Telehealth: Payer: Self-pay | Admitting: Oncology

## 2011-08-17 NOTE — Telephone Encounter (Signed)
Pt called to get her appts scheduled for 2013 scheduled the appt with the pt over the phone

## 2011-11-26 ENCOUNTER — Emergency Department (HOSPITAL_COMMUNITY)
Admission: EM | Admit: 2011-11-26 | Discharge: 2011-11-26 | Disposition: A | Discharge status: Home / Self Care | Payer: Self-pay | Attending: Emergency Medicine | Admitting: Emergency Medicine

## 2011-11-26 ENCOUNTER — Encounter (HOSPITAL_COMMUNITY): Payer: Self-pay | Admitting: Emergency Medicine

## 2011-11-26 DIAGNOSIS — Z79899 Other long term (current) drug therapy: Secondary | ICD-10-CM | POA: Insufficient documentation

## 2011-11-26 DIAGNOSIS — J029 Acute pharyngitis, unspecified: Secondary | ICD-10-CM | POA: Insufficient documentation

## 2011-11-26 DIAGNOSIS — R112 Nausea with vomiting, unspecified: Secondary | ICD-10-CM | POA: Insufficient documentation

## 2011-11-26 DIAGNOSIS — Z86718 Personal history of other venous thrombosis and embolism: Secondary | ICD-10-CM | POA: Insufficient documentation

## 2011-11-26 DIAGNOSIS — J45909 Unspecified asthma, uncomplicated: Secondary | ICD-10-CM | POA: Insufficient documentation

## 2011-11-26 DIAGNOSIS — H9209 Otalgia, unspecified ear: Secondary | ICD-10-CM | POA: Insufficient documentation

## 2011-11-26 DIAGNOSIS — R42 Dizziness and giddiness: Secondary | ICD-10-CM | POA: Insufficient documentation

## 2011-11-26 DIAGNOSIS — I10 Essential (primary) hypertension: Secondary | ICD-10-CM | POA: Insufficient documentation

## 2011-11-26 LAB — COMPREHENSIVE METABOLIC PANEL
ALT: 15 U/L (ref 0–35)
Albumin: 3.4 g/dL — ABNORMAL LOW (ref 3.5–5.2)
BUN: 16 mg/dL (ref 6–23)
Calcium: 9.2 mg/dL (ref 8.4–10.5)
GFR calc Af Amer: >90 mL/min (ref >90)
Glucose, Bld: 142 mg/dL — ABNORMAL HIGH (ref 70–99)
Sodium: 138 mEq/L (ref 135–145)
Total Protein: 7.5 g/dL (ref 6.0–8.3)

## 2011-11-26 LAB — DIFFERENTIAL
Basophils Relative: 0 % (ref 0–1)
Eosinophils Absolute: 0 10*3/uL (ref 0.0–0.7)
Eosinophils Relative: 0 % (ref 0–5)
Lymphs Abs: 1.3 10*3/uL (ref 0.7–4.0)

## 2011-11-26 LAB — URINALYSIS, ROUTINE W REFLEX MICROSCOPIC
Leukocytes, UA: NEGATIVE
Nitrite: NEGATIVE
Specific Gravity, Urine: 1.015 (ref 1.005–1.030)
pH: 7 (ref 5.0–8.0)

## 2011-11-26 LAB — CBC
MCH: 28.9 pg (ref 26.0–34.0)
MCHC: 33 g/dL (ref 30.0–36.0)
MCV: 87.5 fL (ref 78.0–100.0)
Platelets: 328 10*3/uL (ref 150–400)
RBC: 4.33 MIL/uL (ref 3.87–5.11)

## 2011-11-26 MED ORDER — ONDANSETRON HCL 4 MG/2ML IJ SOLN
4.0000 mg | Freq: Once | INTRAMUSCULAR | Status: DC
  Filled 2011-11-26: qty 2

## 2011-11-26 MED ORDER — FUROSEMIDE 40 MG PO TABS: 40.0000 mg | ORAL_TABLET | Freq: Every day | ORAL | Status: DC

## 2011-11-26 MED ORDER — ONDANSETRON 4 MG PO TBDP
4.0000 mg | ORAL_TABLET | Freq: Once | ORAL | Status: AC
  Administered 2011-11-26: 4 mg via ORAL
  Filled 2011-11-26: qty 1

## 2011-11-26 MED ORDER — SODIUM CHLORIDE 0.9 % IV SOLN
Freq: Once | INTRAVENOUS | Status: AC
  Administered 2011-11-26: 05:00:00 via INTRAVENOUS

## 2011-11-26 MED ORDER — ONDANSETRON 4 MG PO TBDP: 4.0000 mg | ORAL_TABLET | Freq: Three times a day (TID) | ORAL | Status: AC | PRN

## 2011-11-26 MED ORDER — AZITHROMYCIN 250 MG PO TABS: 250.0000 mg | ORAL_TABLET | Freq: Every day | ORAL | Status: AC

## 2011-11-26 NOTE — ED Notes (Signed)
Family at bedside. PA at beside. Pt aware of reason for extended wait.

## 2011-11-26 NOTE — ED Provider Notes (Signed)
Medical screening examination/treatment/procedure(s) were performed by non-physician practitioner and as supervising physician I was immediately available for consultation/collaboration.  Olivia Mackie, MD 11/26/11 [phone]

## 2011-11-26 NOTE — ED Notes (Signed)
Pt states she is feeling much better. Pt ambulates to discharge without difficulty.

## 2011-11-26 NOTE — ED Provider Notes (Signed)
History     CSN: [id number]  Arrival date & time 11/26/11  [place]   First MD Initiated Contact with Patient 11/26/11 [phone]      Chief Complaint  Patient presents with  . Dizziness  . Sore Throat  . Otalgia    (Consider location/radiation/quality/duration/timing/severity/associated sxs/prior treatment) Patient is a 51 y.o. female presenting with pharyngitis. The history is provided by the patient. No language interpreter was used.  Sore Throat This is a new problem. The current episode started today. The problem occurs constantly. The problem has been unchanged. Associated symptoms include nausea and a sore throat. The symptoms are aggravated by nothing. She has tried nothing for the symptoms. The treatment provided moderate relief.  Pt complains of a sore throat for 5 days.  Pt reports she vomited once. Pt reports she has had a history of pulmonary embolus.  Pt reports she is on arixtra to prevent clots.    Past Medical History  Diagnosis Date  . Hypertension   . Absent kidney, congenital   . Asthma   . Anxiety   . Atypical chest pain   . History of pulmonary embolism     History reviewed. No pertinent past surgical history.  Family History  Problem Relation Age of Onset  . Coronary artery disease Mother   . Heart attack Mother     History  Substance Use Topics  . Smoking status: Unknown If Ever Smoked  . Smokeless tobacco: Not on file  . Alcohol Use: Yes     occasional     OB History    Grav Para Term Preterm Abortions TAB SAB Ect Mult Living                  Review of Systems  HENT: Positive for sore throat.   Gastrointestinal: Positive for nausea.  All other systems reviewed and are negative.    Allergies  Clonidine hydrochloride; Hydrocodone; Hydrocodone-acetaminophen; Ibuprofen; Iohexol; and Naproxen  Home Medications   Current Outpatient Rx  Name Route Sig Dispense Refill  . ALBUTEROL SULFATE HFA 108 (90 BASE) MCG/ACT IN AERS Inhalation Inhale  1-2 puffs into the lungs every 6 (six) hours as needed.      . FONDAPARINUX SODIUM 7.5 MG/0.6ML [place] SOLN Subcutaneous Inject 7.5 mg into the skin daily.      . FUROSEMIDE 20 MG PO TABS Oral Take 20 mg by mouth daily.      Marland Kitchen LORAZEPAM 1 MG PO TABS Oral Take 1 mg by mouth every 6 (six) hours as needed.        BP 137/107  Pulse 90  Temp(Src) 98 F (36.7 C) (Oral)  Resp 20  SpO2 97%  Physical Exam  Nursing note and vitals reviewed. Constitutional: She is oriented to person, place, and time. She appears well-developed and well-nourished.  HENT:  Head: Normocephalic and atraumatic.  Right Ear: External ear normal.  Left Ear: External ear normal.  Nose: Nose normal.       Throat erythematous  Eyes: Conjunctivae and EOM are normal. Pupils are equal, round, and reactive to light.  Neck: Normal range of motion. Neck supple.  Cardiovascular: Normal rate and normal heart sounds.   Pulmonary/Chest: Effort normal and breath sounds normal.  Abdominal: Soft. Bowel sounds are normal.  Musculoskeletal: Normal range of motion.  Neurological: She is alert and oriented to person, place, and time. She has normal reflexes.  Skin: Skin is warm.  Psychiatric: She has a normal mood and affect.  ED Course  Procedures (including critical care time)  Labs Reviewed - No data to display No results found.   No diagnosis found.    MDM  Pt given zofran po.  IV Ns x a liters,          Lonia Skinner [place], [place] 11/26/11 [phone]

## 2011-11-26 NOTE — ED Notes (Signed)
Pt brought to the ER by EMS, pt c/o dizziness, sx started about an 1hr ago, sore throat for 5 days, and earache, since last night. Pt x1 episode of emesis. Ambulatory on the scene.

## 2011-11-26 NOTE — Discharge Instructions (Signed)

## 2011-11-26 NOTE — ED Notes (Signed)
Food plate given. Pt states feeling better. Pt on computer and watching tv. No distress noted

## 2011-12-16 ENCOUNTER — Ambulatory Visit: Payer: Self-pay | Admitting: Family

## 2012-03-20 IMAGING — CR DG LUMBAR SPINE COMPLETE 4+V
6 series · 6 of 6 positions shown · non-contrast
Comparison: 10/08/2007

CLINICAL DATA: Pain

LUMBAR SPINE - COMPLETE 4+ VIEW

[t lumbar spine ap]
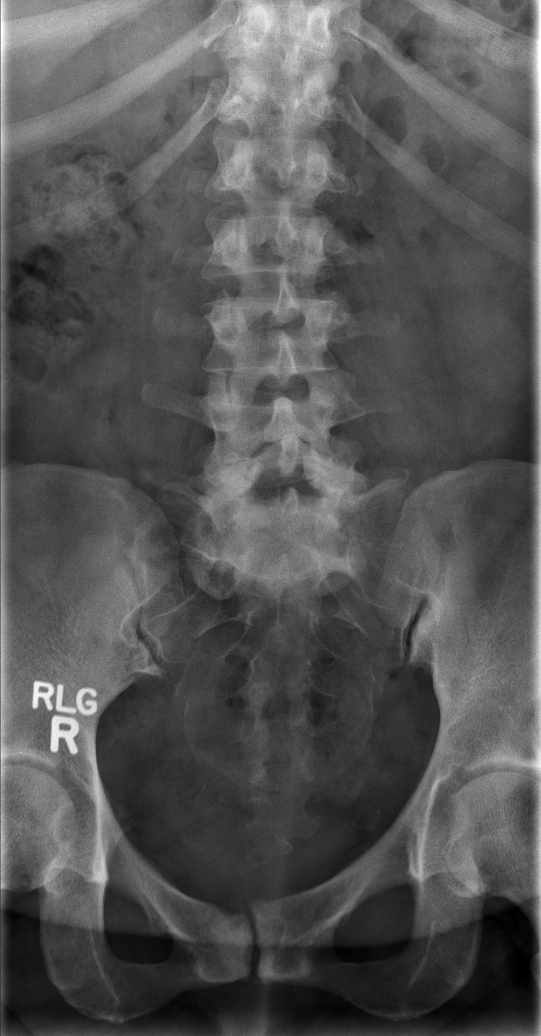

[t lumbar spine obl (1 of 2)]
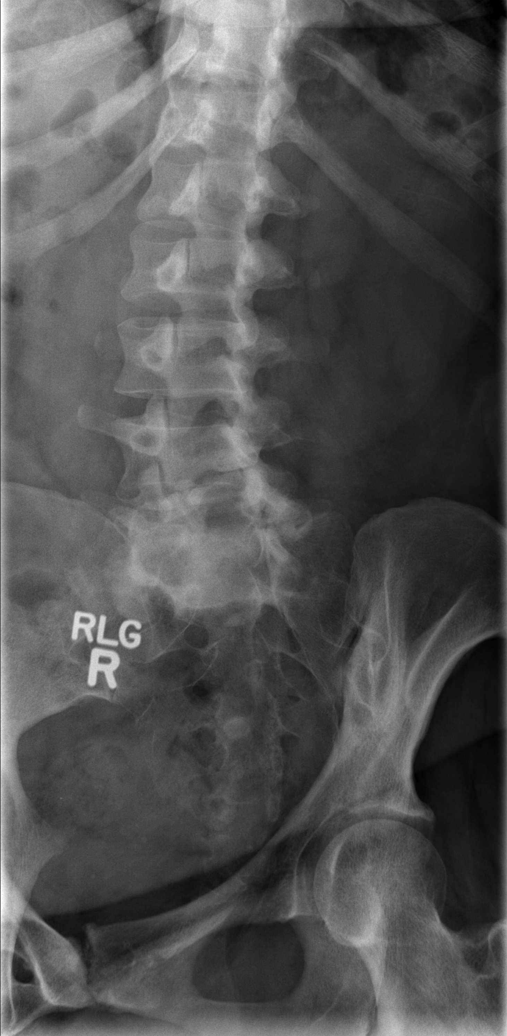

[t lumbar spine obl (2 of 2)]
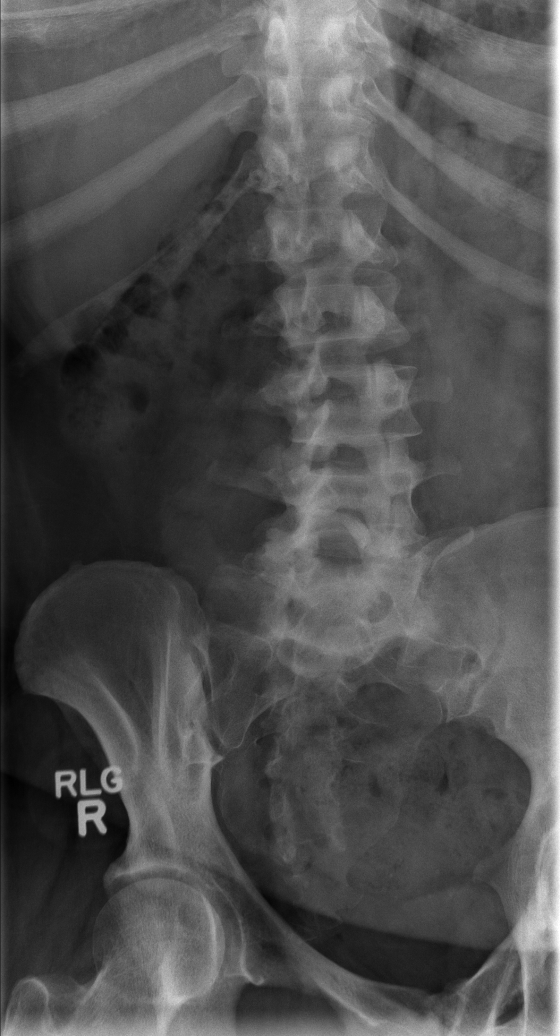

[t lumbar spine lat]
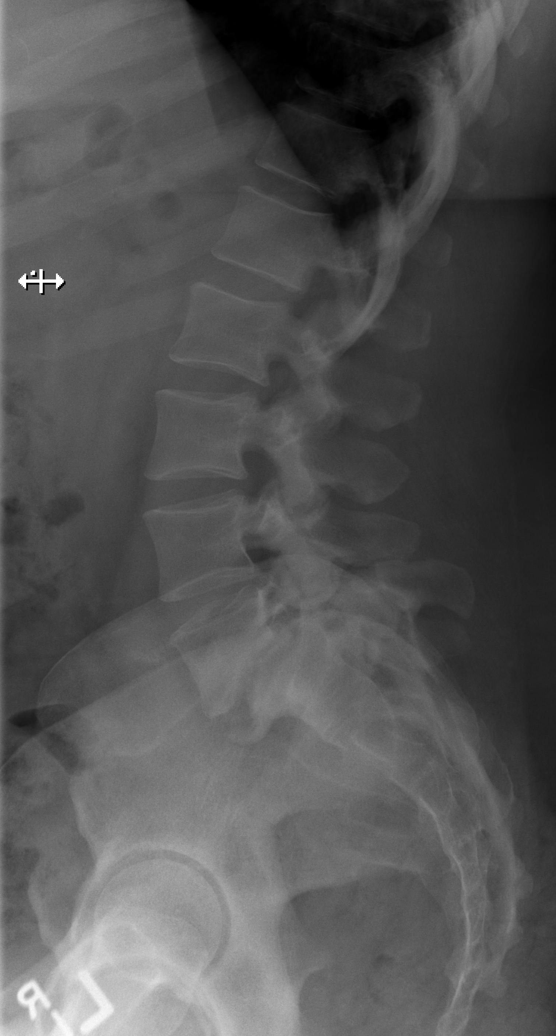

[t lumbar l-5 s-1 spot (1 of 2)]
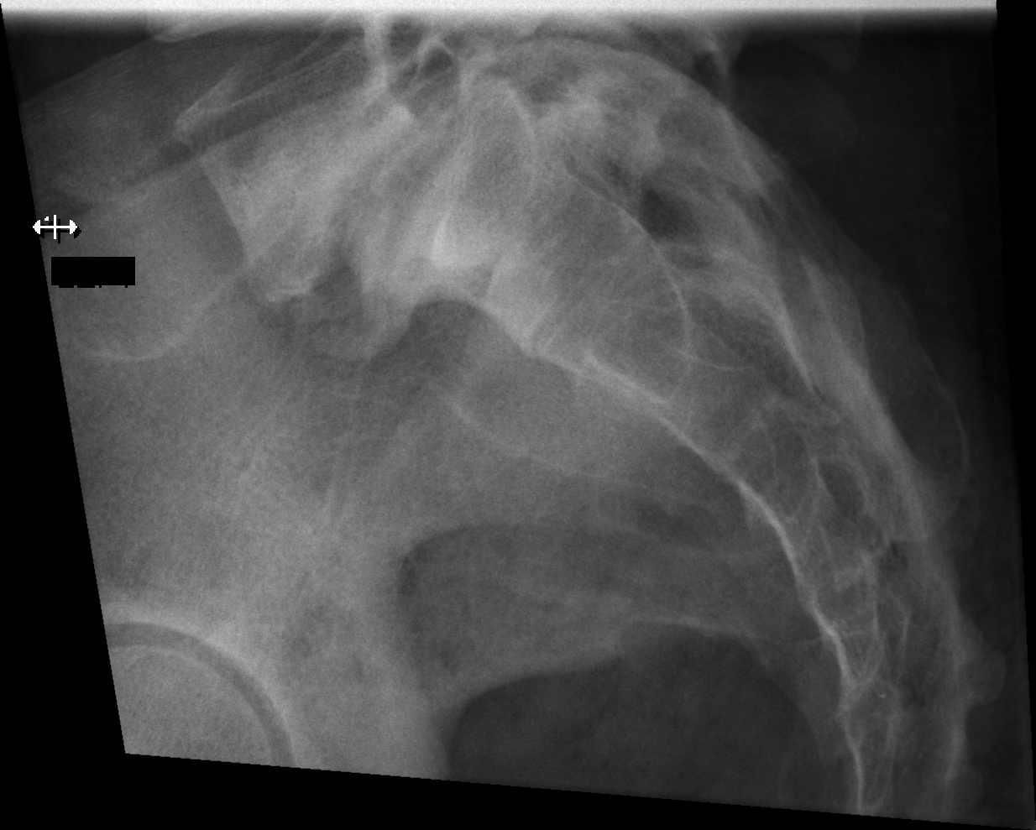

[t lumbar l-5 s-1 spot (2 of 2)]
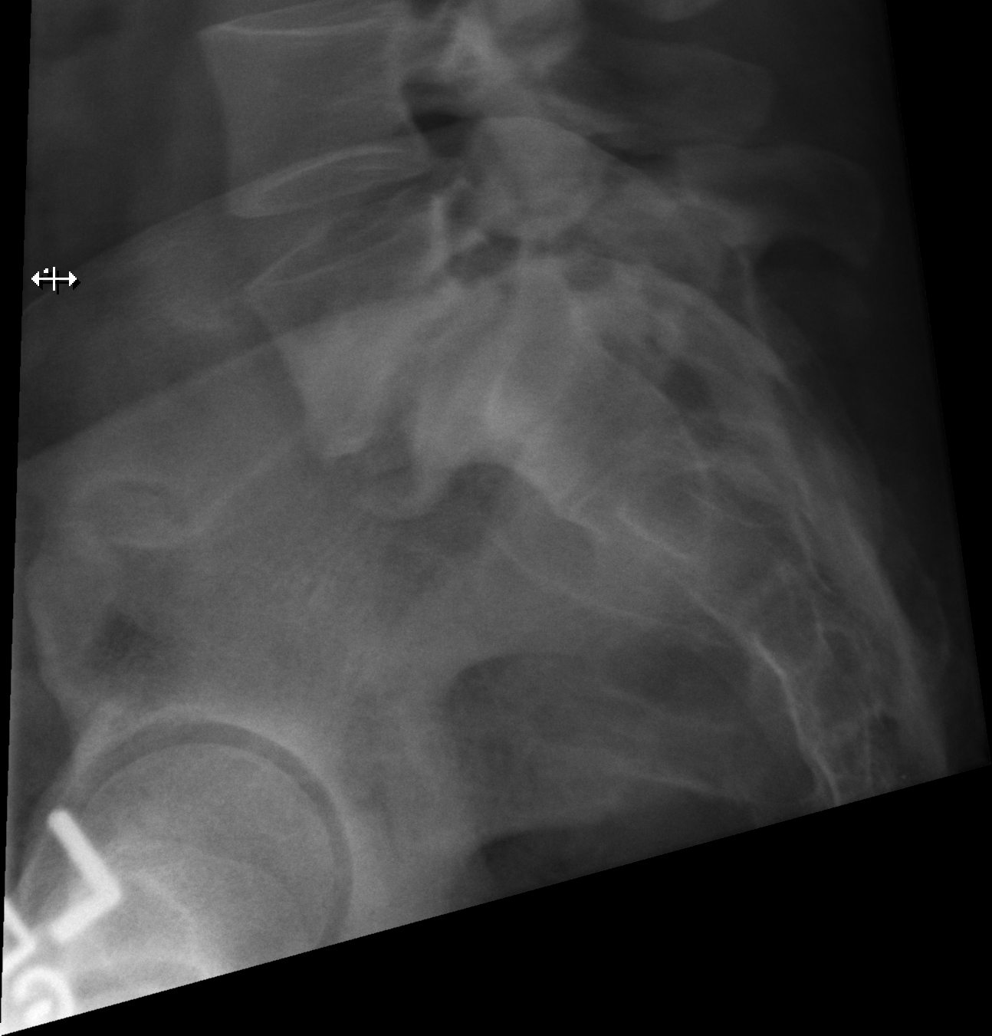

[6 of 6 positions shown; findings below may reference images not displayed]

FINDINGS: Grade II L5-S1 spondylolisthesis is stable. Significant
narrowing of the L5-S1 disc with anterior osteophyte formation.  No
vertebral body height loss.  Disc height is maintained above L5-S1.
No acute fracture.
IMPRESSION: Stable grade II L5-S1 spondylolisthesis.

## 2012-07-14 ENCOUNTER — Emergency Department (HOSPITAL_COMMUNITY)
Admission: EM | Admit: 2012-07-14 | Discharge: 2012-07-15 | Disposition: A | Discharge status: Home / Self Care | Payer: Self-pay | Attending: Emergency Medicine | Admitting: Emergency Medicine

## 2012-07-14 ENCOUNTER — Encounter (HOSPITAL_COMMUNITY): Payer: Self-pay | Admitting: *Deleted

## 2012-07-14 ENCOUNTER — Emergency Department (HOSPITAL_COMMUNITY): Payer: Self-pay

## 2012-07-14 DIAGNOSIS — J45901 Unspecified asthma with (acute) exacerbation: Secondary | ICD-10-CM | POA: Insufficient documentation

## 2012-07-14 DIAGNOSIS — Z86711 Personal history of pulmonary embolism: Secondary | ICD-10-CM | POA: Insufficient documentation

## 2012-07-14 DIAGNOSIS — Z79899 Other long term (current) drug therapy: Secondary | ICD-10-CM | POA: Insufficient documentation

## 2012-07-14 DIAGNOSIS — M549 Dorsalgia, unspecified: Secondary | ICD-10-CM | POA: Insufficient documentation

## 2012-07-14 DIAGNOSIS — Q602 Renal agenesis, unspecified: Secondary | ICD-10-CM | POA: Insufficient documentation

## 2012-07-14 DIAGNOSIS — R52 Pain, unspecified: Secondary | ICD-10-CM | POA: Insufficient documentation

## 2012-07-14 DIAGNOSIS — Z8679 Personal history of other diseases of the circulatory system: Secondary | ICD-10-CM | POA: Insufficient documentation

## 2012-07-14 DIAGNOSIS — F411 Generalized anxiety disorder: Secondary | ICD-10-CM | POA: Insufficient documentation

## 2012-07-14 DIAGNOSIS — I10 Essential (primary) hypertension: Secondary | ICD-10-CM | POA: Insufficient documentation

## 2012-07-14 DIAGNOSIS — R509 Fever, unspecified: Secondary | ICD-10-CM | POA: Insufficient documentation

## 2012-07-14 MED ORDER — ALBUTEROL SULFATE (5 MG/ML) 0.5% IN NEBU
2.5000 mg | INHALATION_SOLUTION | Freq: Once | RESPIRATORY_TRACT | Status: AC
  Administered 2012-07-14: 2.5 mg via RESPIRATORY_TRACT

## 2012-07-14 MED ORDER — IPRATROPIUM BROMIDE 0.02 % IN SOLN
0.5000 mg | Freq: Once | RESPIRATORY_TRACT | Status: AC
  Administered 2012-07-14: 0.5 mg via RESPIRATORY_TRACT
  Filled 2012-07-14: qty 2.5

## 2012-07-14 MED ORDER — ALBUTEROL SULFATE (5 MG/ML) 0.5% IN NEBU
2.5000 mg | INHALATION_SOLUTION | Freq: Once | RESPIRATORY_TRACT | Status: AC
  Administered 2012-07-14: 2.5 mg via RESPIRATORY_TRACT
  Filled 2012-07-14: qty 1

## 2012-07-14 NOTE — ED Notes (Signed)
The pt has asthma and for 3 days she has had some sob from the asthma.  She has been out of meds for awhile.  No wheezing or respiratory distress at present.  lmp none

## 2012-07-14 NOTE — ED Notes (Signed)
Patient called for triage at this time with no answer in the [facility]

## 2012-07-15 ENCOUNTER — Emergency Department (HOSPITAL_COMMUNITY)
Admission: EM | Admit: 2012-07-15 | Discharge: 2012-07-15 | Disposition: A | Discharge status: Home / Self Care | Payer: Self-pay | Attending: Emergency Medicine | Admitting: Emergency Medicine

## 2012-07-15 ENCOUNTER — Emergency Department (HOSPITAL_COMMUNITY): Payer: Self-pay

## 2012-07-15 ENCOUNTER — Encounter (HOSPITAL_COMMUNITY): Payer: Self-pay | Admitting: Emergency Medicine

## 2012-07-15 DIAGNOSIS — J45909 Unspecified asthma, uncomplicated: Secondary | ICD-10-CM | POA: Insufficient documentation

## 2012-07-15 DIAGNOSIS — I10 Essential (primary) hypertension: Secondary | ICD-10-CM | POA: Insufficient documentation

## 2012-07-15 DIAGNOSIS — M25519 Pain in unspecified shoulder: Secondary | ICD-10-CM | POA: Insufficient documentation

## 2012-07-15 DIAGNOSIS — Z86718 Personal history of other venous thrombosis and embolism: Secondary | ICD-10-CM | POA: Insufficient documentation

## 2012-07-15 DIAGNOSIS — F411 Generalized anxiety disorder: Secondary | ICD-10-CM | POA: Insufficient documentation

## 2012-07-15 DIAGNOSIS — Q605 Renal hypoplasia, unspecified: Secondary | ICD-10-CM | POA: Insufficient documentation

## 2012-07-15 DIAGNOSIS — Z79899 Other long term (current) drug therapy: Secondary | ICD-10-CM | POA: Insufficient documentation

## 2012-07-15 DIAGNOSIS — Q602 Renal agenesis, unspecified: Secondary | ICD-10-CM | POA: Insufficient documentation

## 2012-07-15 MED ORDER — ALBUTEROL SULFATE HFA 108 (90 BASE) MCG/ACT IN AERS
1.0000 | INHALATION_SPRAY | RESPIRATORY_TRACT | Status: DC
  Administered 2012-07-15: 2 via RESPIRATORY_TRACT
  Filled 2012-07-15: qty 6.7

## 2012-07-15 MED ORDER — ALBUTEROL SULFATE HFA 108 (90 BASE) MCG/ACT IN AERS: 1.0000 | INHALATION_SPRAY | RESPIRATORY_TRACT | Status: DC | PRN

## 2012-07-15 MED ORDER — ONDANSETRON 4 MG PO TBDP
4.0000 mg | ORAL_TABLET | Freq: Once | ORAL | Status: AC
  Administered 2012-07-15: 4 mg via ORAL
  Filled 2012-07-15: qty 1

## 2012-07-15 MED ORDER — HYDROCODONE-ACETAMINOPHEN 5-325 MG PO TABS: 1.0000 | ORAL_TABLET | Freq: Four times a day (QID) | ORAL | Status: DC | PRN

## 2012-07-15 MED ORDER — PREDNISONE 20 MG PO TABS: ORAL_TABLET | ORAL | Status: DC

## 2012-07-15 MED ORDER — HYDROCODONE-ACETAMINOPHEN 5-325 MG PO TABS
1.0000 | ORAL_TABLET | Freq: Once | ORAL | Status: AC
  Administered 2012-07-15: 1 via ORAL
  Filled 2012-07-15: qty 1

## 2012-07-15 MED ORDER — HYDROCODONE-ACETAMINOPHEN 5-325 MG PO TABS
2.0000 | ORAL_TABLET | Freq: Once | ORAL | Status: AC
  Administered 2012-07-15: 2 via ORAL
  Filled 2012-07-15: qty 2

## 2012-07-15 NOTE — ED Provider Notes (Signed)
History     CSN: [id number]  Arrival date & time 07/15/12  2218   None     Chief Complaint  Patient presents with  . Shoulder Pain    (Consider location/radiation/quality/duration/timing/severity/associated sxs/prior treatment) HPI Comments: Patient has been complaining of right shoulder pain for approximately one week.  She denies any injury.  She thinks she may have arthritis, but does not have a history of arthritis.  She, states she was in [place], when she noticed the pain.  She works in [employer], [employer], so does not do any heavy lifting or strenuous activities.  She has not taken any over-the-counter medication for discomfort.  She is allergic to ibuprofen, and Naprosyn  The history is provided by the patient.    Past Medical History  Diagnosis Date  . Hypertension   . Absent kidney, congenital   . Asthma   . Anxiety   . Atypical chest pain   . History of pulmonary embolism     History reviewed. No pertinent past surgical history.  Family History  Problem Relation Age of Onset  . Coronary artery disease Mother   . Heart attack Mother     History  Substance Use Topics  . Smoking status: Unknown If Ever Smoked  . Smokeless tobacco: Not on file  . Alcohol Use: Yes     Comment: occasional     OB History    Grav Para Term Preterm Abortions TAB SAB Ect Mult Living                  Review of Systems  Constitutional: Negative for fever and chills.  Respiratory: Negative for cough and shortness of breath.   Cardiovascular: Negative for chest pain.  Gastrointestinal: Negative for nausea.  Musculoskeletal: Positive for arthralgias. Negative for joint swelling.  Skin: Negative for wound.    Allergies  Clonidine hydrochloride; Ibuprofen; Iohexol; and Naproxen  Home Medications   Current Outpatient Rx  Name  Route  Sig  Dispense  Refill  . ALBUTEROL SULFATE HFA 108 (90 BASE) MCG/ACT IN AERS   Inhalation   Inhale 1-2 puffs into the lungs every 4 (four) hours  as needed for wheezing.   1 Inhaler   0   . ALBUTEROL SULFATE HFA 108 (90 BASE) MCG/ACT IN AERS   Inhalation   Inhale 1-2 puffs into the lungs every 6 (six) hours as needed. Shortness of breath         . FONDAPARINUX SODIUM 7.5 MG/0.6ML [place] SOLN   Subcutaneous   Inject 7.5 mg into the skin daily.          . FUROSEMIDE 40 MG PO TABS   Oral   Take 1 tablet (40 mg total) by mouth daily.   30 tablet   0   . HYDROCODONE-ACETAMINOPHEN 5-325 MG PO TABS   Oral   Take 1-2 tablets by mouth every 6 (six) hours as needed for pain.   20 tablet   0   . LORAZEPAM 1 MG PO TABS   Oral   Take 1 mg by mouth every 6 (six) hours as needed. anxiety         . HYDROCODONE-ACETAMINOPHEN 5-325 MG PO TABS   Oral   Take 1 tablet by mouth every 6 (six) hours as needed for pain.   20 tablet   0   . PREDNISONE 20 MG PO TABS      3 tabs po day one, then 2 po daily x 4 days  11 tablet   0     BP 140/84  Pulse 93  Temp 97.7 F (36.5 C) (Oral)  Resp 18  SpO2 96%  Physical Exam  Nursing note and vitals reviewed. Constitutional: She appears well-developed.       Morbidly obese  HENT:  Head: Normocephalic.  Eyes: Pupils are equal, round, and reactive to light.  Neck: Normal range of motion.  Cardiovascular: Normal rate.   Pulmonary/Chest: Effort normal.  Musculoskeletal: She exhibits tenderness. She exhibits no edema.       Arms: Neurological: She is alert.  Skin: Skin is warm.    ED Course  Procedures (including critical care time)  Labs Reviewed - No data to display Dg Chest 2 View  07/14/2012  *RADIOLOGY REPORT*  Clinical Data: Shortness of breath and chest tightness. Hypertension.  CHEST - 2 VIEW  Comparison: 04/24/2011  Findings: Shallow inspiration.  Heart size and pulmonary vascularity are normal for technique.  No focal airspace consolidation in the lungs.  No blunting of costophrenic angles. No pneumothorax.  Mediastinal contours appear intact.  No significant changes  since the previous study.  IMPRESSION: No evidence of active pulmonary disease.   Original Report Authenticated By: Burman Nieves, M.D.    Dg Shoulder Right  07/15/2012  **ADDENDUM** CREATED: 07/15/2012 23:33:43  Impression should be corrected to state: "No acute RIGHT SHOULDER abnormalities."  **END ADDENDUM** SIGNED BY: Loraine Leriche A. Tyron Russell, M.D.   07/15/2012  *RADIOLOGY REPORT*  Clinical Data: Worsening right shoulder pain  RIGHT SHOULDER - 2+ VIEW  Comparison: None  Findings: AC joint alignment normal. Osseous mineralization normal. No acute fracture, dislocation or bone destruction. Visualized right ribs intact.  IMPRESSION: No acute intracranial abnormalities.   Original Report Authenticated By: Ulyses Southward, M.D.      1. Shoulder pain, acute       MDM  Is a relatively normal.  X-ray of report the patient to Dr. Magnus Ivan, orthopedics for further evaluation of a rotator cuff injury in the, meantime, I've given her medication.  For pain control           Arman Filter, NP 07/15/12 2336

## 2012-07-15 NOTE — ED Notes (Signed)
Rx x 3.  Pt voiced understanding to f/u with PCP and return for worsening condition.  

## 2012-07-15 NOTE — ED Notes (Signed)
Patient states she has been out of her asthma meds for last three days.  Patient states that she has been feeling achy also for the last few days.

## 2012-07-15 NOTE — ED Provider Notes (Signed)
History     CSN: [id number]  Arrival date & time 07/14/12  2055   First MD Initiated Contact with Patient 07/15/12 0100      Chief Complaint  Patient presents with  . Shortness of Breath    (Consider location/radiation/quality/duration/timing/severity/associated sxs/prior treatment) HPI 51 year old female with a past medical history of asthma presents emergency department with chief complaint of wheezing for past 3 days.  She has had some subjective fever, chills and body aches.  Patient has noted increased wheezing, cough worse at night.  She believes she has an underlying upper respiratory infection.  Patient has a history of pulmonary embolism but is  is on Arixtra for her pulmonary embolus prophylaxis.  Patient states that this feels like asthma and not pulmonary embolism.  She denies any chest pain.  She denies, hemoptysis.She  Has hx chronic back pain. Patient was out of her albuterol inhaler and pain meds.  And came in for treatment.   Denies DOE, chest tightness or pressure, radiation to left arm, jaw or back, or diaphoresis. Denies dysuria, flank pain, suprapubic pain, frequency, urgency, or hematuria. Denies headaches, light headedness, weakness, visual disturbances. Denies abdominal pain, nausea, vomiting, diarrhea or constipation.   Past Medical History  Diagnosis Date  . Hypertension   . Absent kidney, congenital   . Asthma   . Anxiety   . Atypical chest pain   . History of pulmonary embolism     History reviewed. No pertinent past surgical history.  Family History  Problem Relation Age of Onset  . Coronary artery disease Mother   . Heart attack Mother     History  Substance Use Topics  . Smoking status: Unknown If Ever Smoked  . Smokeless tobacco: Not on file  . Alcohol Use: Yes     Comment: occasional     OB History    Grav Para Term Preterm Abortions TAB SAB Ect Mult Living                  Review of Systems Physical Exam  Nursing note and  vitals reviewed. Constitutional: She is oriented to person, place, and time. She appears well-developed and well-nourished. No distress.  HENT:  Head: Normocephalic and atraumatic.  Eyes: Conjunctivae normal and EOM are normal. Pupils are equal, round, and reactive to light. No scleral icterus.  Neck: Normal range of motion.  Cardiovascular: Normal rate, regular rhythm and normal heart sounds.  Exam reveals no gallop and no friction rub.   No murmur heard. Pulmonary/Chest: Effort normal mild expiratory wheezes.  Rhonchi with cough. No respiratory distress.  Abdominal: Soft. Bowel sounds are normal. She exhibits no distension and no mass. There is no tenderness. There is no guarding.  Neurological: She is alert and oriented to person, place, and time.  Skin: Skin is warm and dry. She is not diaphoretic.    Allergies  Clonidine hydrochloride; Ibuprofen; Iohexol; and Naproxen  Home Medications   Current Outpatient Rx  Name  Route  Sig  Dispense  Refill  . ALBUTEROL SULFATE HFA 108 (90 BASE) MCG/ACT IN AERS   Inhalation   Inhale 1-2 puffs into the lungs every 6 (six) hours as needed. Shortness of breath         . FONDAPARINUX SODIUM 7.5 MG/0.6ML [place] SOLN   Subcutaneous   Inject 7.5 mg into the skin daily.          . FUROSEMIDE 40 MG PO TABS   Oral   Take 1 tablet (40  mg total) by mouth daily.   30 tablet   0   . LORAZEPAM 1 MG PO TABS   Oral   Take 1 mg by mouth every 6 (six) hours as needed. anxiety           BP 117/68  Pulse 86  Temp 97.8 F (36.6 C) (Oral)  Resp 16  SpO2 96%  Physical Exam Physical Exam  Nursing note and vitals reviewed. Constitutional: She is oriented to person, place, and time. She appears well-developed and well-nourished. No distress.  HENT:  Head: Normocephalic and atraumatic.  Eyes: Conjunctivae normal and EOM are normal. Pupils are equal, round, and reactive to light. No scleral icterus.  Neck: Normal range of motion.   Cardiovascular: Normal rate, regular rhythm and normal heart sounds.  Exam reveals no gallop and no friction rub.   No murmur heard. Pulmonary/Chest: Effort normal, slight expiratory wheezes. O2 100% on RA. Abdominal: Soft. Bowel sounds are normal. She exhibits no distension and no mass. There is no tenderness. There is no guarding.  Neurological: She is alert and oriented to person, place, and time.  Skin: Skin is warm and dry. She is not diaphoretic.    ED Course  Procedures (including critical care time)  Labs Reviewed - No data to display Dg Chest 2 View  07/14/2012  *RADIOLOGY REPORT*  Clinical Data: Shortness of breath and chest tightness. Hypertension.  CHEST - 2 VIEW  Comparison: 04/24/2011  Findings: Shallow inspiration.  Heart size and pulmonary vascularity are normal for technique.  No focal airspace consolidation in the lungs.  No blunting of costophrenic angles. No pneumothorax.  Mediastinal contours appear intact.  No significant changes since the previous study.  IMPRESSION: No evidence of active pulmonary disease.   Original Report Authenticated By: Burman Nieves, M.D.      No diagnosis found.    MDM   Filed Vitals:   07/14/12 2114 07/15/12 0013  BP: 124/84 117/68  Pulse: 101 86  Temp: 98.2 F (36.8 C) 97.8 F (36.6 C)  TempSrc: Oral Oral  Resp: 18 16  SpO2: 99% 96%   Patient with stable vital signs.  O2 saturation is 99% on room air.  Going to discharge the patient with albuterol inhaler, a short course of oral steroid and small amoutn of pain meds.  Retunr precautions discussed.Patient expresses understanding and agrees with plan.        Arthor Captain, PA-C 07/15/12 2119

## 2012-07-15 NOTE — ED Notes (Signed)
Patient with shoulder pain for the last week.  No injury to the shoulder.  Patient states she thinks that it is arthritis.

## 2012-07-16 NOTE — ED Provider Notes (Signed)
Medical screening examination/treatment/procedure(s) were performed by non-physician practitioner and as supervising physician I was immediately available for consultation/collaboration.   Demitrus Francisco B. Bernette Mayers, MD 07/16/12 2138

## 2012-07-16 NOTE — ED Provider Notes (Signed)
Medical screening examination/treatment/procedure(s) were performed by non-physician practitioner and as supervising physician I was immediately available for consultation/collaboration.  Hurman Horn, MD 07/16/12 1302

## 2012-08-11 ENCOUNTER — Encounter (HOSPITAL_COMMUNITY): Payer: Self-pay | Admitting: *Deleted

## 2012-08-11 ENCOUNTER — Emergency Department (INDEPENDENT_AMBULATORY_CARE_PROVIDER_SITE_OTHER)
Admission: EM | Admit: 2012-08-11 | Discharge: 2012-08-11 | Disposition: A | Discharge status: Home / Self Care | Payer: Self-pay | Source: Home / Self Care | Attending: Family Medicine | Admitting: Family Medicine

## 2012-08-11 ENCOUNTER — Other Ambulatory Visit (HOSPITAL_COMMUNITY)
Admission: RE | Admit: 2012-08-11 | Discharge: 2012-08-11 | Disposition: A | Discharge status: Home / Self Care | Payer: Self-pay | Source: Ambulatory Visit | Attending: Family Medicine | Admitting: Family Medicine

## 2012-08-11 ENCOUNTER — Emergency Department (INDEPENDENT_AMBULATORY_CARE_PROVIDER_SITE_OTHER): Payer: Self-pay

## 2012-08-11 DIAGNOSIS — M549 Dorsalgia, unspecified: Secondary | ICD-10-CM

## 2012-08-11 DIAGNOSIS — R102 Pelvic and perineal pain: Secondary | ICD-10-CM

## 2012-08-11 DIAGNOSIS — N760 Acute vaginitis: Secondary | ICD-10-CM | POA: Insufficient documentation

## 2012-08-11 DIAGNOSIS — Z113 Encounter for screening for infections with a predominantly sexual mode of transmission: Secondary | ICD-10-CM | POA: Insufficient documentation

## 2012-08-11 DIAGNOSIS — R109 Unspecified abdominal pain: Secondary | ICD-10-CM

## 2012-08-11 DIAGNOSIS — M25569 Pain in unspecified knee: Secondary | ICD-10-CM

## 2012-08-11 DIAGNOSIS — R358 Other polyuria: Secondary | ICD-10-CM

## 2012-08-11 HISTORY — DX: Personal history of diseases of the blood and blood-forming organs and certain disorders involving the immune mechanism: Z86.2

## 2012-08-11 LAB — POCT URINALYSIS DIP (DEVICE)
Glucose, UA: NEGATIVE mg/dL
Ketones, ur: NEGATIVE mg/dL
Specific Gravity, Urine: 1.025 (ref 1.005–1.030)
Urobilinogen, UA: 0.2 mg/dL (ref 0.0–1.0)

## 2012-08-11 MED ORDER — LORAZEPAM 1 MG PO TABS: 1.0000 mg | ORAL_TABLET | Freq: Four times a day (QID) | ORAL | Status: DC | PRN

## 2012-08-11 MED ORDER — TRAMADOL HCL 50 MG PO TABS: 50.0000 mg | ORAL_TABLET | Freq: Four times a day (QID) | ORAL | Status: DC | PRN

## 2012-08-11 NOTE — ED Notes (Signed)
Pt reports frequent urination and aching in her pelvic bones " I pee every 5 minutes and my bones hurt down there and into my back"

## 2012-08-11 NOTE — ED Notes (Signed)
Waiting discharge papers 

## 2012-08-11 NOTE — ED Provider Notes (Signed)
History     CSN: [id number]  Arrival date & time 08/11/12  1427   First MD Initiated Contact with Patient 08/11/12 1551      Chief Complaint  Patient presents with  . Urinary Frequency    (Consider location/radiation/quality/duration/timing/severity/associated sxs/prior treatment) Patient is a 52 y.o. female presenting with frequency and knee pain. The history is provided by the patient.  Urinary Frequency This is a new problem. The current episode started more than 1 week ago. The problem occurs daily. The problem has not changed since onset.Nothing aggravates the symptoms. Nothing relieves the symptoms.  Knee Pain This is a new problem. The current episode started more than 1 week ago. The problem occurs daily. The symptoms are aggravated by walking. She has tried nothing for the symptoms.    Past Medical History  Diagnosis Date  . Hypertension   . Absent kidney, congenital   . Asthma   . Anxiety   . Atypical chest pain   . History of pulmonary embolism   . Thrombophilia, protein S deficiency, remote, resolved   . Thrombophilia, protein C deficiency, remote, resolved     History reviewed. No pertinent past surgical history.  Family History  Problem Relation Age of Onset  . Coronary artery disease Mother   . Heart attack Mother     History  Substance Use Topics  . Smoking status: Unknown If Ever Smoked  . Smokeless tobacco: Not on file  . Alcohol Use: Yes     Comment: occasional     OB History    Grav Para Term Preterm Abortions TAB SAB Ect Mult Living                  Review of Systems  Gastrointestinal: Positive for abdominal distention.  Genitourinary: Positive for frequency.  Musculoskeletal: Positive for back pain and arthralgias.  All other systems reviewed and are negative.    Allergies  Clonidine hydrochloride; Ibuprofen; Iohexol; and Naproxen  Home Medications   Current Outpatient Rx  Name  Route  Sig  Dispense  Refill  . ALBUTEROL  SULFATE HFA 108 (90 BASE) MCG/ACT IN AERS   Inhalation   Inhale 1-2 puffs into the lungs every 4 (four) hours as needed for wheezing.   1 Inhaler   0   . ALBUTEROL SULFATE HFA 108 (90 BASE) MCG/ACT IN AERS   Inhalation   Inhale 1-2 puffs into the lungs every 6 (six) hours as needed. Shortness of breath         . FONDAPARINUX SODIUM 7.5 MG/0.6ML [place] SOLN   Subcutaneous   Inject 7.5 mg into the skin daily.          . FUROSEMIDE 40 MG PO TABS   Oral   Take 1 tablet (40 mg total) by mouth daily.   30 tablet   0   . HYDROCODONE-ACETAMINOPHEN 5-325 MG PO TABS   Oral   Take 1-2 tablets by mouth every 6 (six) hours as needed for pain.   20 tablet   0   . HYDROCODONE-ACETAMINOPHEN 5-325 MG PO TABS   Oral   Take 1 tablet by mouth every 6 (six) hours as needed for pain.   20 tablet   0   . LORAZEPAM 1 MG PO TABS   Oral   Take 1 mg by mouth every 6 (six) hours as needed. anxiety         . PREDNISONE 20 MG PO TABS      3 tabs po  day one, then 2 po daily x 4 days   11 tablet   0   . TRAMADOL HCL 50 MG PO TABS   Oral   Take 1 tablet (50 mg total) by mouth every 6 (six) hours as needed for pain.   15 tablet   0     BP 120/75  Pulse 88  Temp 98.1 F (36.7 C) (Oral)  Resp 20  SpO2 100%  Physical Exam  Nursing note and vitals reviewed. Constitutional: She is oriented to person, place, and time. Vital signs are normal. She appears well-developed and well-nourished. She is active and cooperative.  HENT:  Head: Normocephalic.  Eyes: Conjunctivae normal are normal. Pupils are equal, round, and reactive to light. No scleral icterus.  Neck: Trachea normal. Neck supple.  Cardiovascular: Normal rate, regular rhythm, normal heart sounds and intact distal pulses.   Pulmonary/Chest: Effort normal and breath sounds normal.  Abdominal: Soft. Bowel sounds are normal. There is tenderness in the suprapubic area. There is no rebound and no CVA tenderness.  Musculoskeletal:        Right knee: She exhibits normal range of motion, no swelling, no effusion, no ecchymosis, no deformity, no laceration, no erythema, normal alignment, no LCL laxity, normal patellar mobility, no bony tenderness, normal meniscus and no MCL laxity. tenderness found. Medial joint line tenderness noted.       Cervical back: Normal.       Thoracic back: Normal.       Lumbar back: She exhibits tenderness. She exhibits normal range of motion, no bony tenderness, no swelling, no edema, no deformity, no laceration, no pain and no spasm.  Neurological: She is alert and oriented to person, place, and time. She has normal strength and normal reflexes. No cranial nerve deficit or sensory deficit. Coordination and gait normal. GCS eye subscore is 4. GCS verbal subscore is 5. GCS motor subscore is 6.  Skin: Skin is warm and dry.  Psychiatric: She has a normal mood and affect. Her speech is normal and behavior is normal. Judgment and thought content normal. Cognition and memory are normal.    ED Course  Procedures (including critical care time)   Labs Reviewed  POCT URINALYSIS DIP (DEVICE)  URINE CYTOLOGY ANCILLARY ONLY   Dg Knee Complete 4 Views Right  08/11/2012  *RADIOLOGY REPORT*  Clinical Data: Medial anterior right knee pain, no known injury  RIGHT KNEE - COMPLETE 4+ VIEW  Comparison: None.  Findings: Medial lumbar joint space narrowing and marginal spur formation. Joint space narrowing, spur formation and articular irregularity also identified at the patellofemoral joint. Osseous mineralization normal. No acute fracture, dislocation or bone destruction. Small bony excrescence identified at the medial margin of the medial femoral condyle question sequela of prior MCL injury. No knee joint effusion.  IMPRESSION: Osteoarthritic changes of the right knee. Question prior MCL injury. No acute abnormalities.   Original Report Authenticated By: Ulyses Southward, M.D.      1. Suprapubic pain   2. Polyuria   3. Back  pain   4. Knee pain       MDM  Urine dip without abnormality, await urine cytology.  Pt not in room, staff reports pt declines further evaluation (pelvic exam), states she is ready to go home.  Advised to follow up with PCP for further evaluation.  Ultram for muscle pain.   1740 discussed with pt her diagnosis and plan for treatment, states she has had had pelvic discomfort for two months, does not  believe related to STD exposure, advised to have further evaluation at [facility].       Johnsie Kindred, NP 08/11/12 1729  Johnsie Kindred, NP 08/11/12 [phone]

## 2012-08-18 NOTE — ED Provider Notes (Signed)
Medical screening examination/treatment/procedure(s) were performed by resident physician or non-physician practitioner and as supervising physician I was immediately available for consultation/collaboration.   Jago Carton DOUGLAS MD.    Deloris Mittag D Amethyst Gainer, MD 08/18/12 1757 

## 2012-08-21 ENCOUNTER — Encounter (HOSPITAL_COMMUNITY): Payer: Self-pay | Admitting: Family Medicine

## 2012-08-21 ENCOUNTER — Emergency Department (HOSPITAL_COMMUNITY)
Admission: EM | Admit: 2012-08-21 | Discharge: 2012-08-21 | Disposition: A | Discharge status: Home / Self Care | Payer: Self-pay | Attending: Emergency Medicine | Admitting: Emergency Medicine

## 2012-08-21 ENCOUNTER — Emergency Department (HOSPITAL_COMMUNITY): Payer: Self-pay

## 2012-08-21 DIAGNOSIS — Z79899 Other long term (current) drug therapy: Secondary | ICD-10-CM | POA: Insufficient documentation

## 2012-08-21 DIAGNOSIS — N949 Unspecified condition associated with female genital organs and menstrual cycle: Secondary | ICD-10-CM | POA: Insufficient documentation

## 2012-08-21 DIAGNOSIS — I10 Essential (primary) hypertension: Secondary | ICD-10-CM | POA: Insufficient documentation

## 2012-08-21 DIAGNOSIS — M1610 Unilateral primary osteoarthritis, unspecified hip: Secondary | ICD-10-CM | POA: Insufficient documentation

## 2012-08-21 DIAGNOSIS — Q602 Renal agenesis, unspecified: Secondary | ICD-10-CM | POA: Insufficient documentation

## 2012-08-21 DIAGNOSIS — Z862 Personal history of diseases of the blood and blood-forming organs and certain disorders involving the immune mechanism: Secondary | ICD-10-CM | POA: Insufficient documentation

## 2012-08-21 DIAGNOSIS — Q605 Renal hypoplasia, unspecified: Secondary | ICD-10-CM | POA: Insufficient documentation

## 2012-08-21 DIAGNOSIS — F411 Generalized anxiety disorder: Secondary | ICD-10-CM | POA: Insufficient documentation

## 2012-08-21 DIAGNOSIS — M169 Osteoarthritis of hip, unspecified: Secondary | ICD-10-CM | POA: Insufficient documentation

## 2012-08-21 DIAGNOSIS — Z86711 Personal history of pulmonary embolism: Secondary | ICD-10-CM | POA: Insufficient documentation

## 2012-08-21 DIAGNOSIS — R350 Frequency of micturition: Secondary | ICD-10-CM | POA: Insufficient documentation

## 2012-08-21 DIAGNOSIS — J45909 Unspecified asthma, uncomplicated: Secondary | ICD-10-CM | POA: Insufficient documentation

## 2012-08-21 LAB — URINALYSIS, ROUTINE W REFLEX MICROSCOPIC
Glucose, UA: NEGATIVE mg/dL
Leukocytes, UA: NEGATIVE
Nitrite: NEGATIVE
Protein, ur: NEGATIVE mg/dL
pH: 7 (ref 5.0–8.0)

## 2012-08-21 LAB — WET PREP, GENITAL: Trich, Wet Prep: NONE SEEN

## 2012-08-21 MED ORDER — OXYCODONE-ACETAMINOPHEN 5-325 MG PO TABS: 1.0000 | ORAL_TABLET | ORAL | Status: AC | PRN

## 2012-08-21 NOTE — ED Notes (Signed)
Patient transported to X-ray 

## 2012-08-21 NOTE — ED Notes (Signed)
Patient is resting comfortably. 

## 2012-08-21 NOTE — ED Provider Notes (Signed)
History     CSN: [id number]  Arrival date & time 08/21/12  1353   First MD Initiated Contact with Patient 08/21/12 1505      Chief Complaint  Patient presents with  . Back Pain    (Consider location/radiation/quality/duration/timing/severity/associated sxs/prior treatment) Patient is a 52 y.o. female presenting with female genitourinary complaint. The history is provided by the patient.  Female GU Problem Primary symptoms include pelvic pain. There has been no fever. The fever has been present for 5 days or more. This is a new problem. The current episode started more than 1 week ago. The problem occurs constantly. The problem has not changed since onset.The symptoms occur spontaneously. She is not pregnant. LMP: 10 years ago. The discharge was normal. Associated symptoms include frequency. Pertinent negatives include no abdominal pain, no diarrhea, no nausea and no vomiting. She has tried nothing for the symptoms. Sexual activity: sexually active. There is a concern regarding sexually transmitted diseases. She uses nothing for contraception.    Past Medical History  Diagnosis Date  . Hypertension   . Absent kidney, congenital   . Asthma   . Anxiety   . Atypical chest pain   . History of pulmonary embolism   . Thrombophilia, protein S deficiency, remote, resolved   . Thrombophilia, protein C deficiency, remote, resolved     History reviewed. No pertinent past surgical history.  Family History  Problem Relation Age of Onset  . Coronary artery disease Mother   . Heart attack Mother     History  Substance Use Topics  . Smoking status: Unknown If Ever Smoked  . Smokeless tobacco: Not on file  . Alcohol Use: Yes     Comment: occasional     OB History    Grav Para Term Preterm Abortions TAB SAB Ect Mult Living                  Review of Systems  Constitutional: Negative for fever and fatigue.  HENT: Negative for congestion, rhinorrhea and postnasal drip.   Eyes:  Negative for photophobia and visual disturbance.  Respiratory: Negative for chest tightness, shortness of breath and wheezing.   Cardiovascular: Negative for chest pain, palpitations and leg swelling.  Gastrointestinal: Negative for nausea, vomiting, abdominal pain and diarrhea.  Genitourinary: Positive for frequency and pelvic pain. Negative for urgency and difficulty urinating.  Musculoskeletal: Negative for back pain and arthralgias.  Skin: Negative for rash and wound.  Neurological: Negative for weakness and headaches.  Psychiatric/Behavioral: Negative for confusion and agitation.    Allergies  Clonidine hydrochloride; Ibuprofen; Iohexol; and Naproxen  Home Medications   Current Outpatient Rx  Name  Route  Sig  Dispense  Refill  . ALBUTEROL SULFATE HFA 108 (90 BASE) MCG/ACT IN AERS   Inhalation   Inhale 8 puffs into the lungs daily as needed. For wheezing         . FUROSEMIDE 20 MG PO TABS   Oral   Take 20 mg by mouth every other day.         Marland Kitchen LORAZEPAM 1 MG PO TABS   Oral   Take 1 mg by mouth at bedtime.         . FONDAPARINUX SODIUM 7.5 MG/0.6ML [place] SOLN   Subcutaneous   Inject 7.5 mg into the skin every other day.          . OXYCODONE-ACETAMINOPHEN 5-325 MG PO TABS   Oral   Take 1 tablet by mouth every 4 (four)  hours as needed for pain.   6 tablet   0     BP 127/97  Pulse 77  Temp 98.3 F (36.8 C) (Oral)  Resp 14  SpO2 96%  Physical Exam  Nursing note and vitals reviewed. Constitutional: She is oriented to person, place, and time. She appears well-developed and well-nourished. No distress.  HENT:  Head: Normocephalic and atraumatic.  Mouth/Throat: Oropharynx is clear and moist.  Eyes: EOM are normal. Pupils are equal, round, and reactive to light.  Neck: Normal range of motion. Neck supple.  Cardiovascular: Normal rate, regular rhythm, normal heart sounds and intact distal pulses.   Pulmonary/Chest: Effort normal and breath sounds normal. She  has no wheezes. She has no rales.  Abdominal: Soft. Bowel sounds are normal. She exhibits no distension. There is no tenderness. There is no rebound and no guarding.  Genitourinary: No tenderness around the vagina. Vaginal discharge found.       Unable to palpate any inguinal lymph nodes. Tenderness overlying pubic bone. Mild amount of vaginal discharge but no significant CMT adnexal/uterine tenderness.  Musculoskeletal: Normal range of motion. She exhibits no edema and no tenderness.       Moderate tenderness overlying pubic symphysis  Lymphadenopathy:    She has no cervical adenopathy.       Right: No inguinal adenopathy present.       Left: No inguinal adenopathy present.  Neurological: She is alert and oriented to person, place, and time. She displays normal reflexes. No cranial nerve deficit. She exhibits normal muscle tone. Coordination normal.  Skin: Skin is warm and dry. No rash noted.  Psychiatric: She has a normal mood and affect. Her behavior is normal.    ED Course  Procedures (including critical care time)  Labs Reviewed  WET PREP, GENITAL - Abnormal; Notable for the following:    WBC, Wet Prep HPF POC FEW (*)     All other components within normal limits  GLUCOSE, CAPILLARY - Abnormal; Notable for the following:    Glucose-Capillary 107 (*)     All other components within normal limits  URINALYSIS, ROUTINE W REFLEX MICROSCOPIC  GC/CHLAMYDIA PROBE AMP   Dg Pelvis 1-2 Views  08/21/2012  *RADIOLOGY REPORT*  Clinical Data: Pelvic pains.  PELVIS - 1-2 VIEW  Comparison: None.  Findings: Hips are symmetric.  No significant degenerative changes. There are degenerative changes at the symphysis pubis.  Sacrum appears grossly intact.  Degenerative changes at the lumbosacral junction and sacroiliac joints.  IMPRESSION: Degenerative changes involving the lumbosacral junction, sacroiliac joints and symphysis pubis.  No acute findings.   Original Report Authenticated By: Leanna Battles,  M.D.      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 Pain is worse after she has been sitting for an extended period of time and then gets up to move around. No recent fevers, vomiting, recent injuries, vaginal discharge, or vaginal bleeding. Has never had this pain before. Exam as noted above. Concern for ligamentous strain vs pelvic infection vs fracture vs UTI. Will obtain urine studies, perform pelvic exam, and obtain pelvic xray. Pt does not want anything for pain.  Pelvic exam without concern for PID. Wet prep without many WBC. Xray concerning for degenerative changes of pubic symphysis, lumbar spine, and SI joints. No concern for osteo/infectious cause. Felt stable for d/c home. Return precautions given. Given health on call number for pcp f/u.  Johnnette Gourd, MD 08/21/12 2100

## 2012-08-21 NOTE — ED Notes (Signed)
Patient is alert and orientedx4.  Patient was explained discharge instructions and she understood them with no questions.  Patient is taking the bus.

## 2012-08-21 NOTE — ED Notes (Signed)
Pt refuses to speak to this RN. Pt requesting to only speak with MD. Pt placed on monitor. Call light within reach.

## 2012-08-21 NOTE — ED Provider Notes (Signed)
I saw and evaluated the patient, reviewed the resident's note and I agree with the findings and plan. Patient with complaints in the pelvic regions as well as vaginal discharge no new sexual partner within the last month. Patient denies any fever and is otherwise well-appearing. Plain films showed degenerative changes in the pelvis and wet diapers unrevealing. Patient was treated for PID  Gwyneth Sprout, MD 08/21/12 2337

## 2012-08-21 NOTE — ED Notes (Signed)
Per pt for 2 weeks she has been having bilateral pelvic pain after sitting for long periods of time and then standing up. sts it feels tight like she needs to stretch. sts also polyuria. sts her joints hurt

## 2012-08-22 LAB — GC/CHLAMYDIA PROBE AMP
CT Probe RNA: NEGATIVE
GC Probe RNA: NEGATIVE

## 2013-07-12 ENCOUNTER — Other Ambulatory Visit: Payer: Self-pay | Admitting: *Deleted

## 2013-07-12 DIAGNOSIS — I5189 Other ill-defined heart diseases: Secondary | ICD-10-CM

## 2013-07-18 ENCOUNTER — Telehealth: Payer: Self-pay | Admitting: Internal Medicine

## 2013-07-18 NOTE — Telephone Encounter (Signed)
New problem  1. 12/16 Call patient at [phone] . To give date & time of appt . S/w Patient stated she is not in [place] , does not know who's Dr. Tenny Craw is . Stated to patient she is schedule for cardiac mri on  12/19 @ pm . Patient stated why she is having this when she does not know nothing about it. Explain to patient that I will contact Dr. Tenny Craw and Nurse Stanton Kidney to get clarification of situation. Patient hung up the phone.    2. Patient call back spoke with Omer Jack to cancel appt . Stated by Esmond Camper brien patient was upset regarding a call on her phone with appt that she did not make - does not see any one in our practice . Apologize was given and appt was cancel out.  Patient wanted information to office stated she will be suing due to someone using her personal information    Cancel Rsn: Patient (female called back saying this is not her she is in [place]. cancel. O)  3. Per request from Dr. Tenny Craw to schedule cardiac mri on  12/19 .   Attaching message from Deliah Goody on  12/11   Order is placed for cardiac MRI- 07-20-13 is what ross told pt. Please call the pt to make sure she is aware of where to go. Thanks for all you do.   Attaching message from Lady Deutscher at [facility]    Scheduled for pm on 12/19.   Thanks,  K

## 2013-07-20 ENCOUNTER — Ambulatory Visit (HOSPITAL_COMMUNITY): Payer: Self-pay

## 2013-07-27 NOTE — Telephone Encounter (Signed)
Forwarded to gina lurz to review

## 2014-11-11 NOTE — Telephone Encounter (Signed)
Entered in error

## 2015-04-24 ENCOUNTER — Other Ambulatory Visit: Payer: Self-pay | Admitting: Hematology

## 2019-06-25 ENCOUNTER — Other Ambulatory Visit: Payer: Self-pay | Admitting: Family Medicine

## 2019-06-25 DIAGNOSIS — M25511 Pain in right shoulder: Secondary | ICD-10-CM

## 2022-06-21 ENCOUNTER — Other Ambulatory Visit: Payer: Self-pay | Admitting: Sports Medicine

## 2022-06-21 DIAGNOSIS — M545 Low back pain, unspecified: Secondary | ICD-10-CM
